# Patient Record
Sex: Male | Born: 1940
Health system: Southern US, Community
[De-identification: ages and names within clinical notes are randomized; demographics above are authoritative.]

## PROBLEM LIST (undated history)

## (undated) DIAGNOSIS — Z8711 Personal history of peptic ulcer disease: Secondary | ICD-10-CM

## (undated) DIAGNOSIS — M1990 Unspecified osteoarthritis, unspecified site: Secondary | ICD-10-CM

## (undated) DIAGNOSIS — E785 Hyperlipidemia, unspecified: Secondary | ICD-10-CM

## (undated) DIAGNOSIS — E119 Type 2 diabetes mellitus without complications: Secondary | ICD-10-CM

## (undated) DIAGNOSIS — I10 Essential (primary) hypertension: Secondary | ICD-10-CM

## (undated) DIAGNOSIS — T7840XA Allergy, unspecified, initial encounter: Secondary | ICD-10-CM

## (undated) DIAGNOSIS — R011 Cardiac murmur, unspecified: Secondary | ICD-10-CM

## (undated) DIAGNOSIS — D45 Polycythemia vera: Secondary | ICD-10-CM

## (undated) DIAGNOSIS — E781 Pure hyperglyceridemia: Secondary | ICD-10-CM

## (undated) DIAGNOSIS — K219 Gastro-esophageal reflux disease without esophagitis: Secondary | ICD-10-CM

## (undated) HISTORY — DX: Type 2 diabetes mellitus without complications: E11.9

## (undated) HISTORY — PX: TONSILLECTOMY: SUR1361

## (undated) HISTORY — DX: Essential (primary) hypertension: I10

## (undated) HISTORY — DX: Pure hyperglyceridemia: E78.1

## (undated) HISTORY — PX: WISDOM TOOTH EXTRACTION: SHX21

## (undated) HISTORY — DX: Hyperlipidemia, unspecified: E78.5

## (undated) HISTORY — DX: Gastro-esophageal reflux disease without esophagitis: K21.9

## (undated) HISTORY — DX: Cardiac murmur, unspecified: R01.1

## (undated) HISTORY — DX: Unspecified osteoarthritis, unspecified site: M19.90

## (undated) HISTORY — DX: Polycythemia vera: D45

## (undated) HISTORY — DX: Allergy, unspecified, initial encounter: T78.40XA

## (undated) HISTORY — PX: UPPER GASTROINTESTINAL ENDOSCOPY: SHX188

## (undated) HISTORY — DX: Personal history of peptic ulcer disease: Z87.11

## (undated) HISTORY — PX: MENISCUS REPAIR: SHX5179

---
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
## 1973-05-16 HISTORY — PX: HERNIA REPAIR: SHX51

## 1974-05-16 HISTORY — PX: HEMORRHOID SURGERY: SHX153

## 1993-06-23 ENCOUNTER — Encounter: Payer: Self-pay | Admitting: Internal Medicine

## 1999-04-06 ENCOUNTER — Encounter: Payer: Self-pay | Admitting: Neurosurgery

## 1999-04-06 ENCOUNTER — Ambulatory Visit (HOSPITAL_COMMUNITY): Admission: RE | Admit: 1999-04-06 | Discharge: 1999-04-06 | Payer: Self-pay | Admitting: Neurosurgery

## 1999-05-21 ENCOUNTER — Encounter: Payer: Self-pay | Admitting: Neurosurgery

## 1999-05-25 ENCOUNTER — Inpatient Hospital Stay (HOSPITAL_COMMUNITY): Admission: RE | Admit: 1999-05-25 | Discharge: 1999-05-26 | Payer: Self-pay | Admitting: Neurosurgery

## 1999-05-25 ENCOUNTER — Encounter: Payer: Self-pay | Admitting: Neurosurgery

## 1999-05-26 ENCOUNTER — Encounter: Payer: Self-pay | Admitting: Internal Medicine

## 1999-06-11 ENCOUNTER — Encounter: Payer: Self-pay | Admitting: Neurosurgery

## 1999-06-11 ENCOUNTER — Encounter: Admission: RE | Admit: 1999-06-11 | Discharge: 1999-06-11 | Payer: Self-pay | Admitting: Neurosurgery

## 2001-01-10 ENCOUNTER — Encounter: Payer: Self-pay | Admitting: Internal Medicine

## 2001-01-10 ENCOUNTER — Ambulatory Visit (HOSPITAL_COMMUNITY): Admission: RE | Admit: 2001-01-10 | Discharge: 2001-01-10 | Payer: Self-pay | Admitting: *Deleted

## 2001-01-10 ENCOUNTER — Encounter (INDEPENDENT_AMBULATORY_CARE_PROVIDER_SITE_OTHER): Payer: Self-pay | Admitting: *Deleted

## 2001-05-16 HISTORY — PX: CERVICAL SPINE SURGERY: SHX589

## 2004-08-04 ENCOUNTER — Encounter (INDEPENDENT_AMBULATORY_CARE_PROVIDER_SITE_OTHER): Payer: Self-pay | Admitting: *Deleted

## 2004-08-04 ENCOUNTER — Encounter: Payer: Self-pay | Admitting: Internal Medicine

## 2004-08-04 ENCOUNTER — Ambulatory Visit (HOSPITAL_COMMUNITY): Admission: RE | Admit: 2004-08-04 | Discharge: 2004-08-04 | Payer: Self-pay | Admitting: *Deleted

## 2009-05-16 HISTORY — PX: COLONOSCOPY: SHX174

## 2009-08-27 ENCOUNTER — Encounter: Payer: Self-pay | Admitting: Internal Medicine

## 2009-09-08 ENCOUNTER — Encounter: Payer: Self-pay | Admitting: Internal Medicine

## 2009-09-16 ENCOUNTER — Ambulatory Visit: Payer: Self-pay | Admitting: Hematology & Oncology

## 2009-09-24 ENCOUNTER — Encounter (INDEPENDENT_AMBULATORY_CARE_PROVIDER_SITE_OTHER): Payer: Self-pay | Admitting: *Deleted

## 2009-10-05 LAB — CBC WITH DIFFERENTIAL (CANCER CENTER ONLY)
Eosinophils Absolute: 0.1 10*3/uL (ref 0.0–0.5)
LYMPH#: 1.7 10*3/uL (ref 0.9–3.3)
MONO#: 0.5 10*3/uL (ref 0.1–0.9)
NEUT#: 3.7 10*3/uL (ref 1.5–6.5)
Platelets: 176 10*3/uL (ref 145–400)
RBC: 5.6 10*6/uL (ref 4.20–5.70)
WBC: 6.1 10*3/uL (ref 4.0–10.0)

## 2009-10-08 LAB — COMPREHENSIVE METABOLIC PANEL
ALT: 25 U/L (ref 0–53)
CO2: 23 mEq/L (ref 19–32)
Calcium: 9.5 mg/dL (ref 8.4–10.5)
Chloride: 104 mEq/L (ref 96–112)
Sodium: 140 mEq/L (ref 135–145)
Total Bilirubin: 0.8 mg/dL (ref 0.3–1.2)
Total Protein: 7 g/dL (ref 6.0–8.3)

## 2009-10-08 LAB — FERRITIN: Ferritin: 57 ng/mL (ref 22–322)

## 2009-10-08 LAB — LACTATE DEHYDROGENASE: LDH: 175 U/L (ref 94–250)

## 2009-10-15 ENCOUNTER — Encounter (INDEPENDENT_AMBULATORY_CARE_PROVIDER_SITE_OTHER): Payer: Self-pay | Admitting: *Deleted

## 2009-10-19 ENCOUNTER — Ambulatory Visit: Payer: Self-pay | Admitting: Internal Medicine

## 2009-10-19 ENCOUNTER — Encounter (INDEPENDENT_AMBULATORY_CARE_PROVIDER_SITE_OTHER): Payer: Self-pay | Admitting: *Deleted

## 2009-11-10 ENCOUNTER — Ambulatory Visit: Payer: Self-pay | Admitting: Internal Medicine

## 2009-11-15 ENCOUNTER — Encounter: Payer: Self-pay | Admitting: Internal Medicine

## 2010-01-22 ENCOUNTER — Ambulatory Visit: Payer: Self-pay | Admitting: Hematology & Oncology

## 2010-01-25 LAB — MANUAL DIFFERENTIAL (CHCC SATELLITE)
ALC: 1.7 10*3/uL (ref 0.9–3.3)
PLT EST ~~LOC~~: ADEQUATE
SEG: 64 % (ref 40–75)

## 2010-01-25 LAB — CBC WITH DIFFERENTIAL (CANCER CENTER ONLY)
HCT: 52.7 % — ABNORMAL HIGH (ref 38.7–49.9)
HGB: 18 g/dL — ABNORMAL HIGH (ref 13.0–17.1)
MCV: 93 fL (ref 82–98)
Platelets: 189 10*3/uL (ref 145–400)
RBC: 5.64 10*6/uL (ref 4.20–5.70)
WBC: 5.8 10*3/uL (ref 4.0–10.0)

## 2010-02-08 LAB — CBC WITH DIFFERENTIAL (CANCER CENTER ONLY)
BASO%: 3.4 % — ABNORMAL HIGH (ref 0.0–2.0)
EOS%: 2.8 % (ref 0.0–7.0)
HGB: 16.9 g/dL (ref 13.0–17.1)
LYMPH#: 1.8 10*3/uL (ref 0.9–3.3)
MCHC: 34 g/dL (ref 32.0–35.9)
NEUT#: 3.2 10*3/uL (ref 1.5–6.5)
RDW: 11.2 % (ref 10.5–14.6)
WBC: 5.9 10*3/uL (ref 4.0–10.0)

## 2010-02-26 ENCOUNTER — Ambulatory Visit: Payer: Self-pay | Admitting: Hematology & Oncology

## 2010-03-01 LAB — CBC WITH DIFFERENTIAL (CANCER CENTER ONLY)
BASO#: 0.1 10*3/uL (ref 0.0–0.2)
EOS%: 3.2 % (ref 0.0–7.0)
Eosinophils Absolute: 0.2 10*3/uL (ref 0.0–0.5)
HGB: 16.5 g/dL (ref 13.0–17.1)
LYMPH#: 1.8 10*3/uL (ref 0.9–3.3)
MONO#: 0.4 10*3/uL (ref 0.1–0.9)
NEUT#: 2.7 10*3/uL (ref 1.5–6.5)
RBC: 5.16 10*6/uL (ref 4.20–5.70)
WBC: 5.1 10*3/uL (ref 4.0–10.0)

## 2010-05-28 ENCOUNTER — Ambulatory Visit: Payer: Self-pay | Admitting: Hematology & Oncology

## 2010-05-31 LAB — CBC WITH DIFFERENTIAL (CANCER CENTER ONLY)
BASO#: 0.1 10*3/uL (ref 0.0–0.2)
BASO%: 1.9 % (ref 0.0–2.0)
EOS%: 2.7 % (ref 0.0–7.0)
Eosinophils Absolute: 0.2 10*3/uL (ref 0.0–0.5)
HCT: 50.2 % — ABNORMAL HIGH (ref 38.7–49.9)
HGB: 17 g/dL (ref 13.0–17.1)
LYMPH#: 1.7 10*3/uL (ref 0.9–3.3)
LYMPH%: 28.3 % (ref 14.0–48.0)
MCH: 30.6 pg (ref 28.0–33.4)
MCHC: 34 g/dL (ref 32.0–35.9)
MCV: 90 fL (ref 82–98)
MONO#: 0.6 10*3/uL (ref 0.1–0.9)
MONO%: 9.4 % (ref 0.0–13.0)
NEUT#: 3.5 10*3/uL (ref 1.5–6.5)
NEUT%: 57.7 % (ref 40.0–80.0)
Platelets: 172 10*3/uL (ref 145–400)
RBC: 5.56 10*6/uL (ref 4.20–5.70)
RDW: 12.6 % (ref 10.5–14.6)
WBC: 6.1 10*3/uL (ref 4.0–10.0)

## 2010-05-31 LAB — FERRITIN: Ferritin: 32 ng/mL (ref 22–322)

## 2010-05-31 LAB — CHCC SATELLITE - SMEAR

## 2010-06-15 NOTE — Procedures (Signed)
Summary: Colonoscopy pg 2/[place]  Colonoscopy/[place]   Imported By: Sherian Rein 11/12/2009 11:55:31  _____________________________________________________________________  External Attachment:    Type:   Image     Comment:   External Document

## 2010-06-15 NOTE — Letter (Signed)
Summary: Medication List/[facility]  [facility]   Imported By: Sherian Rein 11/12/2009 12:04:56  _____________________________________________________________________  External Attachment:    Type:   Image     Comment:   External Document

## 2010-06-15 NOTE — Procedures (Signed)
Summary: Colonscopy/[place]  Colonscopy/[place]   Imported By: Sherian Rein 11/12/2009 12:02:40  _____________________________________________________________________  External Attachment:    Type:   Image     Comment:   External Document

## 2010-06-15 NOTE — Procedures (Signed)
Summary: Colonoscopy  Patient: Gary Carpenter Note: All result statuses are Final unless otherwise noted.  Tests: (1) Colonoscopy (COL)   COL Colonoscopy           DONE     [facility]     [address]     [place], [place]  [address]           COLONOSCOPY PROCEDURE REPORT           PATIENT:  [name], [name]  MR#:  [id number]     BIRTHDATE:  [birth year], 69 yrs. old  GENDER:  male     ENDOSCOPIST:  Iva Boop, MD, [facility]     REF. BY:  Soyla Murphy. [name], M.D.     PROCEDURE DATE:  11/10/2009     PROCEDURE:  Colonoscopy with biopsy and snare polypectomy     ASA CLASS:  Class II     INDICATIONS:  surveillance and high-risk screening, history of     pre-cancerous (adenomatous) colon polyps diminutive adenomas     removed 2002 and 2006     MEDICATIONS:   Fentanyl 50 mcg IV, Versed 6 mg IV           DESCRIPTION OF PROCEDURE:   After the risks benefits and     alternatives of the procedure were thoroughly explained, informed     consent was obtained.  Digital rectal exam was performed and     revealed no abnormalities and normal prostate.   The LB CF-H180AL     [id number] endoscope was introduced through the anus and advanced to     the cecum, which was identified by both the appendix and ileocecal     valve, without limitations.  The quality of the prep was     excellent, using MoviPrep.  The instrument was then slowly     withdrawn as the colon was fully examined.     Insertion: 2:33 minutes and Withdrawal: 16:29 minutes     <<PROCEDUREIMAGES>>           FINDINGS:  There were multiple polyps identified and removed.     Seven polyps from splenic flexure to sigmoid. Size range 1-5 mm.     Six polyps bottle 1 and sigmoid polyp (1) in bottle 2. The polyps     were removed using cold biopsy forceps or were snared without     cautery. Retrieval was successful. Severe diverticulosis was found     in the sigmoid colon.  This was otherwise a normal examination of     the colon. This  includes right colon retroflexion.   Retroflexed     views in the rectum revealed no abnormalities.    The scope was     then withdrawn from the patient and the procedure completed.           COMPLICATIONS:  None     ENDOSCOPIC IMPRESSION:     1) Polyps, multiple removed (seven, maximum size 5mm)     2) Severe diverticulosis in the sigmoid colon     3) Otherwise normal examination, excellent prep     4) Personal history of diminutive adenoma removal 2002 and 2006.           REPEAT EXAM:  In for Colonoscopy, pending biopsy results.           Iva Boop, MD, [name]           CC:  [name], MD  The Patient           n.     eSIGNED:   Iva Boop at 11/10/2009 12:16 PM           [name], [id number]  Note: An exclamation mark (!) indicates a result that was not dispersed into the flowsheet. Document Creation Date: 11/10/2009 12:16 PM _______________________________________________________________________  (1) Order result status: Final Collection or observation date-time: 11/10/2009 12:05 Requested date-time:  Receipt date-time:  Reported date-time:  Referring Physician:   Ordering Physician: Stan Head [phone]) Specimen Source:  Source: [name] Order Number: [id number] Lab site:   Appended Document: Colonoscopy     Procedures Next Due Date:    Colonoscopy: 10/2014

## 2010-06-15 NOTE — Procedures (Signed)
Summary: Colonoscopy/[place]  Colonoscopy/[place]   Imported By: Sherian Rein 11/12/2009 12:01:43  _____________________________________________________________________  External Attachment:    Type:   Image     Comment:   External Document

## 2010-06-15 NOTE — Letter (Signed)
Summary: Patient Notice- Polyp Results  [facility]  [address] [place], [place] [address]   Phone: [phone]  Fax: [phone]        November 15, 2009 MRN: [id number]    [facility] [address] [place], [place]  [address]    Dear Mr. Swaney,  Some of the polyps removed from your colon were adenomatous. This means that they were pre-cancerous or that  they had the potential to change into cancer over time.  I recommend that you have a repeat colonoscopy in 5 years to determine if you have developed any new polyps over time. If you develop any new rectal bleeding, abdominal pain or significant bowel habit changes, please contact us before then.  Please call us if you are having persistent problems or have questions about your condition that have not been fully answered at this time.   Sincerely,  Iva Boop MD, [facility]  This letter has been electronically signed by your physician.  Appended Document: Patient Notice- Polyp Results letter mailed 7.6.11

## 2010-06-15 NOTE — Letter (Signed)
Summary: [address]  [place]   Imported By: Sherian Rein 11/12/2009 11:58:47  _____________________________________________________________________  External Attachment:    Type:   Image     Comment:   External Document

## 2010-06-15 NOTE — Letter (Signed)
Summary: [facility]   Imported By: Sherian Rein 11/12/2009 11:57:58  _____________________________________________________________________  External Attachment:    Type:   Image     Comment:   External Document

## 2010-06-15 NOTE — Letter (Signed)
Summary: [facility] Instructions  [facility]  [address] [place], [place] [address]   Phone: [phone]  Fax: [phone]       ABDULLAHI VALLONE    03-10-1941    MRN: [id number]        Procedure Day Dorna Bloom:  Jake Shark  11/10/09     Arrival Time:  10:00AM     Procedure Time:  11:00AM     Location of Procedure:                    _X _  [facility] (4th Floor)                        PREPARATION FOR COLONOSCOPY WITH MOVIPREP   Starting 5 days prior to your procedure 11/05/09 do not eat nuts, seeds, popcorn, corn, beans, peas,  salads, or any raw vegetables.  Do not take any fiber supplements (e.g. Metamucil, Citrucel, and Benefiber).  THE DAY BEFORE YOUR PROCEDURE         DATE: 11/09/09  DAY: MONDAY  1.  Drink clear liquids the entire day-NO SOLID FOOD  2.  Do not drink anything colored red or purple.  Avoid juices with pulp.  No orange juice.  3.  Drink at least 64 oz. (8 glasses) of fluid/clear liquids during the day to prevent dehydration and help the prep work efficiently.  CLEAR LIQUIDS INCLUDE: Water Jello Ice Popsicles Tea (sugar ok, no milk/cream) Powdered fruit flavored drinks Coffee (sugar ok, no milk/cream) Gatorade Juice: apple, white grape, white cranberry  Lemonade Clear bullion, consomm, broth Carbonated beverages (any kind) Strained chicken noodle soup Hard Candy                             4.  In the morning, mix first dose of MoviPrep solution:    Empty 1 Pouch A and 1 Pouch B into the disposable container    Add lukewarm drinking water to the top line of the container. Mix to dissolve    Refrigerate (mixed solution should be used within 24 hrs)  5.  Begin drinking the prep at 5:00 p.m. The MoviPrep container is divided by 4 marks.   Every 15 minutes drink the solution down to the next mark (approximately 8 oz) until the full liter is complete.   6.  Follow completed prep with 16 oz of clear liquid of your choice (Nothing  red or purple).  Continue to drink clear liquids until bedtime.  7.  Before going to bed, mix second dose of MoviPrep solution:    Empty 1 Pouch A and 1 Pouch B into the disposable container    Add lukewarm drinking water to the top line of the container. Mix to dissolve    Refrigerate  THE DAY OF YOUR PROCEDURE      DATE: 11/10/09  DAY: TUESDAY  Beginning at 6:00AM (5 hours before procedure):         1. Every 15 minutes, drink the solution down to the next mark (approx 8 oz) until the full liter is complete.  2. Follow completed prep with 16 oz. of clear liquid of your choice.    3. You may drink clear liquids until 9:00AM (2 HOURS BEFORE PROCEDURE).   MEDICATION INSTRUCTIONS  Unless otherwise instructed, you should take regular prescription medications with a small sip of water   as early as possible the morning  of your procedure.           OTHER INSTRUCTIONS  You will need a responsible adult at least 68 years of age to accompany you and drive you home.   This person must remain in the waiting room during your procedure.  Wear loose fitting clothing that is easily removed.  Leave jewelry and other valuables at home.  However, you may wish to bring a book to read or  an iPod/MP3 player to listen to music as you wait for your procedure to start.  Remove all body piercing jewelry and leave at home.  Total time from sign-in until discharge is approximately 2-3 hours.  You should go home directly after your procedure and rest.  You can resume normal activities the  day after your procedure.  The day of your procedure you should not:   Drive   Make legal decisions   Operate machinery   Drink alcohol   Return to work  You will receive specific instructions about eating, activities and medications before you leave.    The above instructions have been reviewed and explained to me by   Clide Cliff, RN______________________    I fully understand and can  verbalize these instructions _____________________________ Date _________

## 2010-06-15 NOTE — Miscellaneous (Signed)
Summary: DIR COL...AS.  Clinical Lists Changes  Medications: Added new medication of MOVIPREP 100 GM  SOLR (PEG-KCL-NACL-NASULF-NA ASC-C) As directed - Signed Rx of MOVIPREP 100 GM  SOLR (PEG-KCL-NACL-NASULF-NA ASC-C) As directed;  #1 x 0;  Signed;  Entered by: Clide Cliff RN;  Authorized by: Iva Boop MD, [name];  Method used: Electronically to [facility] [address] [place], [place]  [id number], Ph: [phone], Fax: [phone] Observations: Added new observation of ALLERGY REV: Done (10/19/2009 13:41)    Prescriptions: MOVIPREP 100 GM  SOLR (PEG-KCL-NACL-NASULF-NA ASC-C) As directed  #1 x 0   Entered by:   Clide Cliff RN   Authorized by:   Iva Boop MD, [facility]   Signed by:   Clide Cliff RN on 10/19/2009   Method used:   Electronically to        [facility] (retail)       [address]       [place], [place]  [id number]       Ph: [phone]       Fax: [phone]   RxID:   [id number]

## 2010-06-15 NOTE — Letter (Signed)
Summary: [facility] Instructions  [facility]  [address] [place], [place] [address]   Phone: [phone]  Fax: [phone]       Gary Carpenter    Jan 14, 1941    MRN: [id number]        Procedure Day /Date:  11/10/09  Tuesday     Arrival Time:  10:00am     Procedure Time:_11:00am     Location of Procedure:                    _ x_  [facility] (4th Floor)   PREPARATION FOR COLONOSCOPY WITH MOVIPREP   Starting 5 days prior to your procedure _6/23/11 _ do not eat nuts, seeds, popcorn, corn, beans, peas,  salads, or any raw vegetables.  Do not take any fiber supplements (e.g. Metamucil, Citrucel, and Benefiber).  THE DAY BEFORE YOUR PROCEDURE         DATE:   11/09/09   DAY:  Monday  1.  Drink clear liquids the entire day-NO SOLID FOOD  2.  Do not drink anything colored red or purple.  Avoid juices with pulp.  No orange juice.  3.  Drink at least 64 oz. (8 glasses) of fluid/clear liquids during the day to prevent dehydration and help the prep work efficiently.  CLEAR LIQUIDS INCLUDE: Water Jello Ice Popsicles Tea (sugar ok, no milk/cream) Powdered fruit flavored drinks Coffee (sugar ok, no milk/cream) Gatorade Juice: apple, white grape, white cranberry  Lemonade Clear bullion, consomm, broth Carbonated beverages (any kind) Strained chicken noodle soup Hard Candy                             4.  In the morning, mix first dose of MoviPrep solution:    Empty 1 Pouch A and 1 Pouch B into the disposable container    Add lukewarm drinking water to the top line of the container. Mix to dissolve    Refrigerate (mixed solution should be used within 24 hrs)  5.  Begin drinking the prep at 5:00 p.m. The MoviPrep container is divided by 4 marks.   Every 15 minutes drink the solution down to the next mark (approximately 8 oz) until the full liter is complete.   6.  Follow completed prep with 16 oz of clear liquid of your choice (Nothing red or purple).   Continue to drink clear liquids until bedtime.  7.  Before going to bed, mix second dose of MoviPrep solution:    Empty 1 Pouch A and 1 Pouch B into the disposable container    Add lukewarm drinking water to the top line of the container. Mix to dissolve    Refrigerate  THE DAY OF YOUR PROCEDURE      DATE:  11/10/09  DAY:  Tuesday  Beginning at  6:00 a.m. (5 hours before procedure):         1. Every 15 minutes, drink the solution down to the next mark (approx 8 oz) until the full liter is complete.  2. Follow completed prep with 16 oz. of clear liquid of your choice.    3. You may drink clear liquids until _ 9:00am _ (2 HOURS BEFORE PROCEDURE).   MEDICATION INSTRUCTIONS  Unless otherwise instructed, you should take regular prescription medications with a small sip of water   as early as possible the morning of your procedure.  Diabetic patients - see separate instructions.  Stop  taking Plavix or Aggrenox on  _  _  (7 days before procedure).     Stop taking Coumadin on  _ _  (5 days before procedure).  Additional medication instructions: _         OTHER INSTRUCTIONS  You will need a responsible adult at least 68 years of age to accompany you and drive you home.   This person must remain in the waiting room during your procedure.  Wear loose fitting clothing that is easily removed.  Leave jewelry and other valuables at home.  However, you may wish to bring a book to read or  an iPod/MP3 player to listen to music as you wait for your procedure to start.  Remove all body piercing jewelry and leave at home.  Total time from sign-in until discharge is approximately 2-3 hours.  You should go home directly after your procedure and rest.  You can resume normal activities the  day after your procedure.  The day of your procedure you should not:   Drive   Make legal decisions   Operate machinery   Drink alcohol   Return to work  You will receive specific  instructions about eating, activities and medications before you leave.    The above instructions have been reviewed and explained to me by   _______________________    I fully understand and can verbalize these instructions _____________________________ Date _________

## 2010-06-15 NOTE — Letter (Signed)
Summary: Previsit letter  [facility]  [address] [place], [place] [address]   Phone: [phone]  Fax: [phone]       09/24/2009 MRN: [id number]  [facility] [address] [place], [place]  [address]  Dear Mr. Berges,  Welcome to the Gastroenterology Division at [facility].    You are scheduled to see a nurse for your pre-procedure visit on 10/19/2009 at 1:30PM on the 3rd floor at [facility], [address]  We ask that you try to arrive at our office 15 minutes prior to your appointment time to allow for check-in.  Your nurse visit will consist of discussing your medical and surgical history, your immediate family medical history, and your medications.    Please bring a complete list of all your medications or, if you prefer, bring the medication bottles and we will list them.  We will need to be aware of both prescribed and over the counter drugs.  We will need to know exact dosage information as well.  If you are on blood thinners (Coumadin, Plavix, Aggrenox, Ticlid, etc.) please call our office today/prior to your appointment, as we need to consult with your physician about holding your medication.   Please be prepared to read and sign documents such as consent forms, a financial agreement, and acknowledgement forms.  If necessary, and with your consent, a friend or relative is welcome to sit-in on the nurse visit with you.  Please bring your insurance card so that we may make a copy of it.  If your insurance requires a referral to see a specialist, please bring your referral form from your primary care physician.  No co-pay is required for this nurse visit.     If you cannot keep your appointment, please call [phone] to cancel or reschedule prior to your appointment date.  This allows [place] the opportunity to schedule an appointment for another patient in need of care.    Thank you for choosing [facility] for your medical needs.   We appreciate the opportunity to care for you.  Please visit [place] at our website  to learn more about our practice.                     Sincerely.                                                                                                                   The Gastroenterology Division

## 2010-06-15 NOTE — Procedures (Signed)
Summary: Upper Endoscopy/[place]  Upper Endoscopy/[place]   Imported By: Sherian Rein 11/12/2009 11:59:56  _____________________________________________________________________  External Attachment:    Type:   Image     Comment:   External Document

## 2010-09-15 ENCOUNTER — Other Ambulatory Visit: Payer: Self-pay | Admitting: Hematology & Oncology

## 2010-09-15 ENCOUNTER — Encounter (HOSPITAL_BASED_OUTPATIENT_CLINIC_OR_DEPARTMENT_OTHER): Payer: Medicare Other | Admitting: Hematology & Oncology

## 2010-09-15 DIAGNOSIS — D649 Anemia, unspecified: Secondary | ICD-10-CM

## 2010-09-15 DIAGNOSIS — Z7982 Long term (current) use of aspirin: Secondary | ICD-10-CM

## 2010-09-15 DIAGNOSIS — D751 Secondary polycythemia: Secondary | ICD-10-CM

## 2010-09-15 LAB — CBC WITH DIFFERENTIAL (CANCER CENTER ONLY)
BASO#: 0 10*3/uL (ref 0.0–0.2)
BASO%: 0.3 % (ref 0.0–2.0)
Eosinophils Absolute: 0.1 10*3/uL (ref 0.0–0.5)
HCT: 46.3 % (ref 38.7–49.9)
LYMPH%: 26.6 % (ref 14.0–48.0)
MCV: 84 fL (ref 82–98)
MONO#: 0.7 10*3/uL (ref 0.1–0.9)
NEUT%: 58.9 % (ref 40.0–80.0)
RDW: 13.5 % (ref 11.1–15.7)
WBC: 5.9 10*3/uL (ref 4.0–10.0)

## 2010-10-01 NOTE — Discharge Summary (Signed)
[place]  Patient:    JACOBE, STUDY                          MRN: [id number] Adm. Date:  05/25/99 Disc. Date: 05/26/99 Attending:  Payton Doughty, M.D.                           Discharge Summary  ADMISSION DIAGNOSIS:  Cervical spondylosis at C5-6 and C6-7.  DISCHARGE DIAGNOSIS:  Cervical spondylosis at C5-6 and C6-7.  PROCEDURES:  Anterior cervicectomy and fusion at C5-6 and C6-7 with an A-line plate.  COMPLICATIONS:  None.  CONDITION ON DISCHARGE:  Well.  HISTORY OF PRESENT ILLNESS:  The patient is a 70 year old, right-handed white gentleman whose history and physical is recounted on the chart. He had been followed for several years with increasing neck, left shoulder and arm pain. MRI showed nerve root compression on the left side and he was admitted for anterior  cervicectomy and fusion.  PAST MEDICAL HISTORY:  Unremarkable.  PHYSICAL EXAMINATION:  GENERAL:  Unremarkable.  NEUROLOGICAL:  Left C6 and C7 radiculopathies.  HOSPITAL COURSE:  He was admitted after obtaining normal laboratory values and underwent anterior cervicectomy and fusion at C5-6 and C6-7. Postoperatively, his strength is full. His sensation is intact. He is eating and voiding normally. His incision is dry. He is being discharged home in the care of his family on Vicodin for pain.  FOLLOW-UP:  Will be in two weeks with a lateral C-spine x-ray. DD:  05/26/99 TD:  05/26/99 Job: [id number] UXL/[id number]

## 2010-10-01 NOTE — Op Note (Signed)
Gary Carpenter, Gary Carpenter                 ACCOUNT NO.:  [id number]   MEDICAL RECORD NO.:  [id number]          PATIENT TYPE:  AMB   LOCATION:  ENDO                         FACILITY:  [facility]   PHYSICIAN:  Georgiana Spinner, M.D.    DATE OF BIRTH:  01-18-1941   DATE OF PROCEDURE:  DATE OF DISCHARGE:                                 OPERATIVE REPORT   PROCEDURE:  Colonoscopy.   INDICATIONS:  Colon polyps.   ANESTHESIA:  Demerol 60, Versed 7 mg.   PROCEDURE:  With the patient mildly sedated in the left lateral decubitus  position, the Olympus videoscopic colonoscope was inserted into the rectum,  after a normal rectal exam, and passed under direct vision to the cecum,  identified by Crow's foot of the cecum and ileocecal valve, both which were  photographed.  From this point, the colonoscope was slowly withdrawn taking  circumferential views of the colonic mucosa stopping only to photograph a  polyp in the few folds removed from the ileocecal valve. This was removed  using hot biopsy forceps technique, setting of 20/200 blended current.  We  next stopped at approximately 15 cm from the anal verge at which point a  second polyp was seen, photographed, and it too was removed at this time  using snare cautery technique with the same setting of 20/200 blended  current.  The endoscope was withdrawn to the rectum which appeared normal on  direct and retroflexed view.  The endoscope was straightened and withdrawn.  The patient's vital signs and pulse oximeter remained stable. The patient  tolerated the procedure well without apparent complications.   FINDINGS:  Polyps as described above, one just above the cecum and one at 15  cm from the anal verge, both removed. Await biopsy report. The patient will  call me for results and follow up with me as an outpatient.      GMO/MEDQ  D:  08/04/2004  T:  08/04/2004  Job:  [id number]

## 2010-10-01 NOTE — H&P (Signed)
[place]  Patient:    Gary Carpenter, Gary Carpenter                          MRN: Adm. Date:  05/25/99 Dictator:   Payton Doughty, M.D.                         History and Physical  ADMITTING DIAGNOSIS: 1.  C5-6 and C6-7 spondylosis with a C6 and C7 radiculopathy.  CHIEF COMPLAINT:  Mr. Hinderliter is a 70 year old right handed white gentleman whom I  first saw in December 1997 for scapular pain.  He had soreness out over his trapezius, fatigue in both hands, subjective weakness in his arms.  He underwent an MRI at that time that showed some spondylitic change and some osteophytic intrusion.  He participated in physical therapy and did well and then returned o my office in November with marked increase in his neck pain, pain down his arm. He at that time had weakness in both the biceps and triceps on the left side with diminished sensation.  Films demonstrated a progression of spondylosis at C5-6 nd C6-7 with bi-foraminal narrowing and he is now admitted for an anterior cervical diskectomy and fusion.  PAST MEDICAL HISTORY:  Unremarkable.  He had perirectal abscess followed by Dr.  Rolene Course in 1983.  ALLERGIES:  None.  FAMILY HISTORY:  Mother is deceased of cancer.  Daddy is age 31 and in good health.  SOCIAL HISTORY:  He does not smoke and drinks alcohol on a social basis.  He is a Animator.  REVIEW OF SYSTEMS:  Unremarkable.  PHYSICAL EXAMINATION:  HEENT:  Within normal limits.  He has limited range of motion of his neck with interscapular pain.  CHEST:  Clear.  CARDIAC:  Regular rate and rhythm.  ABDOMEN:  Nontender, no hepatosplenomegaly.  EXTREMITIES:  Without clubbing or cyanosis.  Peripheral pulses good.  GU:  Deferred.  NEUROLOGIC:  Awake, alert and oriented.  Cranial nerves intact.  Motor exam shows 5/5 strength throughout the right upper extremity, left upper extremity has weakness of the biceps and triceps at 4+/5.  He has  left C7 sensory deficit. Reflexes are absent on the left biceps and triceps, 1 at the right biceps, 1 at the right triceps.  Lower extremities are non-myelopathic.  Films results have been reviewed with progression of spondylosis at C5-6 and C6-7 with bi-foraminal narrowing.  CLINICAL IMPRESSION: 1.  Left C6 and C7 radiculopathy secondary to C5-6 and C6-7 spondylosis.  PLAN:  Anterior cervical diskectomy and fusion at C5-C6 and C6-C7.  The risks and benefits of this approach have been discussed with him and he wishes to proceed.DD:  05/25/99 TD:  05/25/99 Job: [id number]

## 2010-10-01 NOTE — Procedures (Signed)
[facility]  Patient:    Gary Carpenter, Gary Carpenter [facility] Visit Number: [id number] MRN: [id number]          Service Type: END Location: ENDO Attending Physician:  Sabino Gasser Proc. Date: 01/10/01 Adm. Date:  01/10/2001                             Procedure Report  PROCEDURE:  Colonoscopy.  INDICATIONS:  Colon polyps.  ANESTHESIA:  Demerol 25, Versed 3 mg.  DESCRIPTION OF PROCEDURE:  With the patient mildly sedated in the left lateral decubitus position, the Olympus videoscopic colonoscope was inserted in the rectum after normal rectal exam and passed under direct vision to the cecum, identified by the ileocecal valve and appendiceal orifice, both of which were photographed.  From this point, the colonoscope was slowly withdrawn, taking circumferential views of the entire colonic mucosa until we reached the rectum.  We stopped first in the proximal transverse colon where two adjacent polyps were seen and removed, both with a setting of 3-3 blended current, one with snare cautery technique, one with hot biopsy forceps technique.  We stopped once again at approximately 40 cm from the anal verge, at which point another polyp was seen, photographed, and removed, again using snare cautery technique.  The base was touched with the wire to provide further hemostasis. There was a small polyp removed in the rectosigmoid area by hot biopsy forceps technique.  It was photographed as well.  In the rectum, there was an area that appeared to be raised.  It was not clear whether this was just normal mucosa that had been raised or whether it was adenomatous tissue.  Therefore, it was photographed and cold biopsies only were taken.  Once accomplished, the endoscope, as stated, had been pulled back to the rectum and placed in retroflexion to view the anal canal from above.  This appeared normal.  The endoscope was straightened and withdrawn.  The patients vital signs and  pulse oximeter remained stable.  The patient tolerated the procedure well without apparent complications.  FINDINGS:  Polyps of proximal transverse colon 40 cm from the anal verge, rectosigmoid area, and possibly a polyp in the rectum as well.  PLAN:  We will await the biopsy report and will have follow up with me for the results and as an outpatient.  May need further study if the last biopsy proves to be adenomatous. Attending Physician:  Sabino Gasser DD:  01/10/01 TD:  01/10/01 Job: [id number]

## 2010-11-24 ENCOUNTER — Ambulatory Visit (HOSPITAL_BASED_OUTPATIENT_CLINIC_OR_DEPARTMENT_OTHER): Payer: Medicare Other | Admitting: Hematology & Oncology

## 2010-11-24 ENCOUNTER — Other Ambulatory Visit: Payer: Self-pay | Admitting: Hematology & Oncology

## 2010-11-24 DIAGNOSIS — D751 Secondary polycythemia: Secondary | ICD-10-CM

## 2010-11-24 LAB — CBC WITH DIFFERENTIAL (CANCER CENTER ONLY)
BASO#: 0 10*3/uL (ref 0.0–0.2)
Eosinophils Absolute: 0.1 10*3/uL (ref 0.0–0.5)
HGB: 16.8 g/dL (ref 13.0–17.1)
LYMPH#: 1.8 10*3/uL (ref 0.9–3.3)
MCH: 31.8 pg (ref 28.0–33.4)
MONO#: 0.5 10*3/uL (ref 0.1–0.9)
NEUT#: 2.9 10*3/uL (ref 1.5–6.5)
Platelets: 144 10*3/uL — ABNORMAL LOW (ref 145–400)
RBC: 5.28 10*6/uL (ref 4.20–5.70)
WBC: 5.3 10*3/uL (ref 4.0–10.0)

## 2011-04-13 ENCOUNTER — Other Ambulatory Visit (HOSPITAL_BASED_OUTPATIENT_CLINIC_OR_DEPARTMENT_OTHER): Payer: Medicare Other | Admitting: Lab

## 2011-04-13 ENCOUNTER — Other Ambulatory Visit: Payer: Self-pay | Admitting: Hematology & Oncology

## 2011-04-13 ENCOUNTER — Ambulatory Visit (HOSPITAL_BASED_OUTPATIENT_CLINIC_OR_DEPARTMENT_OTHER): Payer: Medicare Other | Admitting: Hematology & Oncology

## 2011-04-13 VITALS — BP 155/94 | HR 63 | Temp 97.9°F | Ht 77.0 in | Wt 227.0 lb

## 2011-04-13 DIAGNOSIS — R5381 Other malaise: Secondary | ICD-10-CM

## 2011-04-13 DIAGNOSIS — D751 Secondary polycythemia: Secondary | ICD-10-CM

## 2011-04-13 DIAGNOSIS — Z7982 Long term (current) use of aspirin: Secondary | ICD-10-CM

## 2011-04-13 DIAGNOSIS — D649 Anemia, unspecified: Secondary | ICD-10-CM

## 2011-04-13 LAB — CBC WITH DIFFERENTIAL (CANCER CENTER ONLY)
BASO#: 0 10*3/uL (ref 0.0–0.2)
EOS%: 2.2 % (ref 0.0–7.0)
HCT: 44.3 % (ref 38.7–49.9)
HGB: 16.7 g/dL (ref 13.0–17.1)
MCH: 32.2 pg (ref 28.0–33.4)
MCHC: 37.7 g/dL — ABNORMAL HIGH (ref 32.0–35.9)
MONO%: 11.2 % (ref 0.0–13.0)
NEUT%: 58.1 % (ref 40.0–80.0)

## 2011-04-13 NOTE — Progress Notes (Signed)
This office note has been dictated.

## 2011-04-14 ENCOUNTER — Telehealth: Payer: Self-pay | Admitting: *Deleted

## 2011-04-14 NOTE — Progress Notes (Deleted)
CC:   Soyla Murphy. [name], M.D.  DIAGNOSIS:  [place] polycythemia.  CURRENT THERAPY:  Phlebotomy as needed to keep hematocrit below 45%.  INTERIM HISTORY:  Gary Carpenter comes in for his follow-up.  We last saw him back in July.  Since then, he has been pretty busy.  He actually went to [place] about a month or so ago.  He had a nice time while he was over there.  He went with a group out of [place].  He has had some issues with fatigue.  He says he can go a while and then all of a sudden he just feels very tired.  I am not sure what is the source of this.  He has had no headache.  There has been no double vision or blurred vision.  He has had no change in bowel or bladder habits.  There have been no rashes.  He has had no leg swelling.  He was last phlebotomized back in May.  I suppose there might be some element of iron deficiency which could be causing his intermittent fatigue.  I also suppose that he may have low testosterone.  However, I told him that testosterone replacement therapy probably would not be a good idea as it can drive up his blood counts.  PHYSICAL EXAMINATION:  General Appearance:  This is a well-developed, well-nourished white gentleman in no obvious distress.  Vital Signs: 97.9, pulse 63, respiratory rate 18, blood pressure 155/94.  Weight is 227.  Head and Neck Exam:  Shows a normocephalic, atraumatic skull. There are no ocular or oral lesions.  There are no palpable cervical or supraclavicular lymph nodes.  Lungs:  Clear bilaterally.  There are no rales, wheezes or rhonchi.  Cardiac Exam:  Regular rate and rhythm with a normal S1 and S2.  There are no murmurs, rubs or bruits.  Abdominal Exam:  Soft with good bowel sounds.  There is no palpable abdominal mass.  He has no fluid wave.  There is no palpable hepatosplenomegaly. Extremities:  Show no clubbing, cyanosis or edema.  Skin Exam:  No rashes, ecchymosis, or petechiae.  He does not have any type of  ruddy complexion.  Neurological Exam:  No focal neurological deficits.  LABORATORY STUDIES:  White cell count is 6.3, hemoglobin 16.7, hematocrit 44.3 with a platelet count of 145.  MCV is 85.  IMPRESSION:  Gary Carpenter is a 70 year old gentleman with [place] polycythemia.  Again, he is doing okay with this.  I do not see any complications.  He is still taking aspirin, which I think is very important.  We will plan to get him back in March.  I think we can get him through the wintertime without having to see him.    ______________________________ Gary Carpenter, M.D. PRE/MEDQ  D:  04/13/2011  T:  04/14/2011  Job:  [id number]

## 2011-04-14 NOTE — Telephone Encounter (Signed)
Mailed 3-13 schedule °

## 2011-06-24 DIAGNOSIS — E7800 Pure hypercholesterolemia, unspecified: Secondary | ICD-10-CM | POA: Diagnosis not present

## 2011-06-28 DIAGNOSIS — E7800 Pure hypercholesterolemia, unspecified: Secondary | ICD-10-CM | POA: Diagnosis not present

## 2011-06-28 DIAGNOSIS — R069 Unspecified abnormalities of breathing: Secondary | ICD-10-CM | POA: Diagnosis not present

## 2011-07-14 NOTE — Progress Notes (Signed)
CC:   Gary Carpenter, M.D.  DIAGNOSIS:  [place] polycythemia.  CURRENT THERAPY:  Phlebotomy as needed to keep hematocrit below 45%.  INTERIM HISTORY:  Gary Carpenter comes in for his follow-up.  We last saw him back in July.  Since then, he has been pretty busy.  He actually went to [place] about a month or so ago.  He had a nice time while he was over there.  He went with a group out of [place].  He has had some issues with fatigue.  He says he can go a while and then all of a sudden he just feels very tired.  I am not sure what is the source of this.  He has had no headache.  There has been no double vision or blurred vision.  He has had no change in bowel or bladder habits.  There have been no rashes.  He has had no leg swelling.  He was last phlebotomized back in May.  I suppose there might be some element of iron deficiency which could be causing his intermittent fatigue.  I also suppose that he may have low testosterone.  However, I told him that testosterone replacement therapy probably would not be a good idea as it can drive up his blood counts.  PHYSICAL EXAMINATION:  General Appearance:  This is a well-developed, well-nourished white gentleman in no obvious distress.  Vital Signs: 97.9, pulse 63, respiratory rate 18, blood pressure 155/94.  Weight is 227.  Head and Neck Exam:  Shows a normocephalic, atraumatic skull. There are no ocular or oral lesions.  There are no palpable cervical or supraclavicular lymph nodes.  Lungs:  Clear bilaterally.  There are no rales, wheezes or rhonchi.  Cardiac Exam:  Regular rate and rhythm with a normal S1 and S2.  There are no murmurs, rubs or bruits.  Abdominal Exam:  Soft with good bowel sounds.  There is no palpable abdominal mass.  He has no fluid wave.  There is no palpable hepatosplenomegaly. Extremities:  Show no clubbing, cyanosis or edema.  Skin Exam:  No rashes, ecchymosis, or petechiae.  He does not have any type of  ruddy complexion.  Neurological Exam:  No focal neurological deficits.  LABORATORY STUDIES:  White cell count is 6.3, hemoglobin 16.7, hematocrit 44.3 with a platelet count of 145.  MCV is 85.  IMPRESSION:  Gary Carpenter is a 70-year-old gentleman with [place] polycythemia.  Again, he is doing okay with this.  I do not see any complications.  He is still taking aspirin, which I think is very important.  We will plan to get him back in March.  I think we can get him through the wintertime without having to see him.    ______________________________ Peter R Ennever, M.D. PRE/MEDQ  D:  04/13/2011  T:  04/14/2011  Job:  [id number] 

## 2011-08-11 ENCOUNTER — Other Ambulatory Visit (HOSPITAL_BASED_OUTPATIENT_CLINIC_OR_DEPARTMENT_OTHER): Payer: Medicare Other | Admitting: Lab

## 2011-08-11 ENCOUNTER — Ambulatory Visit (HOSPITAL_BASED_OUTPATIENT_CLINIC_OR_DEPARTMENT_OTHER): Payer: Medicare Other | Admitting: Hematology & Oncology

## 2011-08-11 VITALS — BP 145/78 | HR 67 | Temp 98.1°F | Ht 73.0 in | Wt 227.0 lb

## 2011-08-11 DIAGNOSIS — D45 Polycythemia vera: Secondary | ICD-10-CM

## 2011-08-11 DIAGNOSIS — D751 Secondary polycythemia: Secondary | ICD-10-CM

## 2011-08-11 LAB — CBC WITH DIFFERENTIAL (CANCER CENTER ONLY)
Eosinophils Absolute: 0.2 10*3/uL (ref 0.0–0.5)
HCT: 46.5 % (ref 38.7–49.9)
LYMPH%: 28.7 % (ref 14.0–48.0)
MCV: 86 fL (ref 82–98)
MONO#: 0.6 10*3/uL (ref 0.1–0.9)
NEUT%: 58.3 % (ref 40.0–80.0)
RBC: 5.38 10*6/uL (ref 4.20–5.70)
WBC: 6.2 10*3/uL (ref 4.0–10.0)

## 2011-08-11 NOTE — Progress Notes (Signed)
This office note has been dictated.

## 2011-08-12 NOTE — Progress Notes (Signed)
CC:   Soyla Murphy. [name], M.D.  DIAGNOSIS:  Spurious/[place] polycythemia.  CURRENT THERAPY:  Phlebotomy to maintain hematocrit less than 45%.  INTERIM HISTORY:  [name] comes in for follow-up.  He is doing fairly well.  He got through the holidays without any problems.  He said he was a little bit "gluttonous."  His last phlebotomy was back in May of 2012.  When we last saw him, his ferritin was 71.  Iron saturation was 34%.  He has had no headache.  He has had no pain in his hands or feet.  He has had no change in bowel or bladder habits.  He is still taking his aspirin which I think is very helpful for him.  PHYSICAL EXAMINATION:  General Appearance:  This is a well-developed, well-nourished white gentleman in no obvious distress.  Vital Signs: Show a temperature of 98.1, pulse 67, respiratory rate 14, blood pressure 145/87.  Weight is 227.  Head and Neck Exam:  Shows a normocephalic, atraumatic skull.  There are no ocular or oral lesions. There are no palpable cervical or supraclavicular lymph nodes.  There is no facial plethora.  His conjunctivae are not inflamed.  Lungs:  Clear bilaterally.  Cardiac Exam:  Regular rate and rhythm with a normal S1 and S2.  There are no murmurs, rubs or bruits.  Abdominal Exam:  Soft with good bowel sounds.  There is no palpable abdominal mass.  There is no palpable hepatosplenomegaly.  Extremities:  Show no clubbing, cyanosis or edema.  Neurological Exam:  Shows no focal neurological deficits.  LABORATORY STUDIES:  White cell count is 6.2, hemoglobin 17, hematocrit 46.5, platelet count 142.  MCV is 86.  IMPRESSION:  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  We did do a JAK2 assay on him which was negative.  I am not convinced that we need to phlebotomize him right now.  I suspect that we may need to phlebotomize him the next time we see him.  I want to see him back in May at which point we will probably  plan for a phlebotomy so that we can get him through the summertime.   ______________________________ Josph Macho, M.D. PRE/MEDQ  D:  08/11/2011  T:  08/12/2011  Job:  [id number]

## 2011-08-31 DIAGNOSIS — R0602 Shortness of breath: Secondary | ICD-10-CM | POA: Diagnosis not present

## 2011-08-31 DIAGNOSIS — E7800 Pure hypercholesterolemia, unspecified: Secondary | ICD-10-CM | POA: Diagnosis not present

## 2011-09-12 DIAGNOSIS — I10 Essential (primary) hypertension: Secondary | ICD-10-CM | POA: Diagnosis not present

## 2011-09-12 DIAGNOSIS — R5381 Other malaise: Secondary | ICD-10-CM | POA: Diagnosis not present

## 2011-09-12 DIAGNOSIS — R5383 Other fatigue: Secondary | ICD-10-CM | POA: Diagnosis not present

## 2011-10-04 DIAGNOSIS — I10 Essential (primary) hypertension: Secondary | ICD-10-CM | POA: Diagnosis not present

## 2011-10-04 DIAGNOSIS — E119 Type 2 diabetes mellitus without complications: Secondary | ICD-10-CM | POA: Diagnosis not present

## 2011-10-04 DIAGNOSIS — E7800 Pure hypercholesterolemia, unspecified: Secondary | ICD-10-CM | POA: Diagnosis not present

## 2011-10-11 DIAGNOSIS — E7800 Pure hypercholesterolemia, unspecified: Secondary | ICD-10-CM | POA: Diagnosis not present

## 2011-10-13 ENCOUNTER — Other Ambulatory Visit: Payer: Medicare Other | Admitting: Lab

## 2011-10-13 ENCOUNTER — Ambulatory Visit (HOSPITAL_BASED_OUTPATIENT_CLINIC_OR_DEPARTMENT_OTHER): Payer: Medicare Other | Admitting: Hematology & Oncology

## 2011-10-13 VITALS — BP 123/69 | HR 64 | Temp 96.9°F | Ht 72.0 in | Wt 226.0 lb

## 2011-10-13 DIAGNOSIS — D751 Secondary polycythemia: Secondary | ICD-10-CM | POA: Diagnosis not present

## 2011-10-13 DIAGNOSIS — D45 Polycythemia vera: Secondary | ICD-10-CM | POA: Diagnosis not present

## 2011-10-13 LAB — CBC WITH DIFFERENTIAL (CANCER CENTER ONLY)
BASO%: 0.4 % (ref 0.0–2.0)
LYMPH#: 1.7 10*3/uL (ref 0.9–3.3)
MONO#: 0.5 10*3/uL (ref 0.1–0.9)
Platelets: 152 10*3/uL (ref 145–400)
RDW: 13 % (ref 11.1–15.7)
WBC: 5.7 10*3/uL (ref 4.0–10.0)

## 2011-10-13 LAB — FERRITIN: Ferritin: 86 ng/mL (ref 22–322)

## 2011-10-13 NOTE — Progress Notes (Signed)
This office note has been dictated.

## 2011-10-14 NOTE — Progress Notes (Signed)
CC:   Soyla Murphy. [name], M.D.  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  CURRENT THERAPY:  Phlebotomy to maintain hematocrit less than 45%.  INTERIM HISTORY:  Gary Carpenter comes in for followup.  He has done quite well.  I think his last phlebotomy was back in May 2012.  He has had no issues since we last saw him.  He is trying to exercise. His last ferritin was 71 back in November.  He has had no bleeding or bruising.  He has had no headache.  He has had no pain in his hands or feet.  He is on Crestor.  He says that this will hopefully help with any potential coronary artery disease blockages.  He has had no change in bowel or bladder habits.  He has had no rashes. He has had no cough.  He has had no fever, sweats or chills.  PHYSICAL EXAMINATION:  This is a well-developed, well-nourished white gentleman in no obvious distress.  Vital signs:  Temperature of 96.9, pulse 64, respiratory rate 20, blood pressure 120/69.  Weight is 226. Head and neck:  Normocephalic, atraumatic skull.  There are no ocular or oral lesions.  There are no palpable cervical or supraclavicular lymph nodes.  Lungs:  Clear bilaterally.  Cardiac:  Regular rate and rhythm with a normal S1 and S2.  There are no murmurs, rubs or bruits. Abdomen:  Soft with good bowel sounds.  There is no palpable abdominal mass.  There is no fluid wave.  There is no palpable hepatosplenomegaly. Back:  No tenderness of the spine, ribs or hips.  Extremities:  No clubbing, cyanosis or edema.  Neurologic:  No focal neurological deficits.  Skin:  Some slight stasis dermatitis type changes in his lower legs.  LABORATORY STUDIES:  White cell count is 5.7, hemoglobin 16.6, hematocrit 45, platelet count 152.  MCV is 87.  IMPRESSION:  Gary Carpenter is a 71 year old gentleman with polycythemia. This is a spurious type of polycythemia.  Will plan to get back now in 6 months.  I really do not think we need to get any blood work on him in  between visits.  He is really doing good job in staying active and staying well hydrated.    ______________________________ Josph Macho, M.D. PRE/MEDQ  D:  10/13/2011  T:  10/14/2011  Job:  [id number]

## 2011-11-22 DIAGNOSIS — H52229 Regular astigmatism, unspecified eye: Secondary | ICD-10-CM | POA: Diagnosis not present

## 2011-11-22 DIAGNOSIS — E7800 Pure hypercholesterolemia, unspecified: Secondary | ICD-10-CM | POA: Diagnosis not present

## 2011-11-22 DIAGNOSIS — H353190 Nonexudative age-related macular degeneration, unspecified eye, stage unspecified: Secondary | ICD-10-CM | POA: Diagnosis not present

## 2011-11-22 DIAGNOSIS — H5200 Hypermetropia, unspecified eye: Secondary | ICD-10-CM | POA: Diagnosis not present

## 2011-11-24 DIAGNOSIS — E7800 Pure hypercholesterolemia, unspecified: Secondary | ICD-10-CM | POA: Diagnosis not present

## 2011-11-24 DIAGNOSIS — E669 Obesity, unspecified: Secondary | ICD-10-CM | POA: Diagnosis not present

## 2011-12-19 ENCOUNTER — Other Ambulatory Visit: Payer: Self-pay | Admitting: Dermatology

## 2011-12-19 DIAGNOSIS — L723 Sebaceous cyst: Secondary | ICD-10-CM | POA: Diagnosis not present

## 2011-12-19 DIAGNOSIS — L821 Other seborrheic keratosis: Secondary | ICD-10-CM | POA: Diagnosis not present

## 2011-12-19 DIAGNOSIS — L570 Actinic keratosis: Secondary | ICD-10-CM | POA: Diagnosis not present

## 2012-01-02 DIAGNOSIS — L570 Actinic keratosis: Secondary | ICD-10-CM | POA: Diagnosis not present

## 2012-01-02 DIAGNOSIS — L821 Other seborrheic keratosis: Secondary | ICD-10-CM | POA: Diagnosis not present

## 2012-01-02 DIAGNOSIS — L723 Sebaceous cyst: Secondary | ICD-10-CM | POA: Diagnosis not present

## 2012-04-18 ENCOUNTER — Other Ambulatory Visit (HOSPITAL_BASED_OUTPATIENT_CLINIC_OR_DEPARTMENT_OTHER): Payer: Medicare Other | Admitting: Lab

## 2012-04-18 ENCOUNTER — Ambulatory Visit: Payer: Medicare Other

## 2012-04-18 ENCOUNTER — Ambulatory Visit (HOSPITAL_BASED_OUTPATIENT_CLINIC_OR_DEPARTMENT_OTHER): Payer: Medicare Other | Admitting: Hematology & Oncology

## 2012-04-18 VITALS — BP 145/82 | HR 62 | Temp 97.4°F | Resp 18 | Ht 72.0 in | Wt 228.0 lb

## 2012-04-18 DIAGNOSIS — D751 Secondary polycythemia: Secondary | ICD-10-CM

## 2012-04-18 DIAGNOSIS — D45 Polycythemia vera: Secondary | ICD-10-CM

## 2012-04-18 LAB — CBC WITH DIFFERENTIAL (CANCER CENTER ONLY)
BASO%: 0.5 % (ref 0.0–2.0)
EOS%: 3.5 % (ref 0.0–7.0)
HGB: 16.3 g/dL (ref 13.0–17.1)
LYMPH#: 1.9 10*3/uL (ref 0.9–3.3)
MCHC: 36.5 g/dL — ABNORMAL HIGH (ref 32.0–35.9)
NEUT#: 3.6 10*3/uL (ref 1.5–6.5)
Platelets: 136 10*3/uL — ABNORMAL LOW (ref 145–400)
RDW: 12.7 % (ref 11.1–15.7)

## 2012-04-18 LAB — FERRITIN: Ferritin: 149 ng/mL (ref 22–322)

## 2012-04-18 LAB — IRON AND TIBC
Iron: 87 ug/dL (ref 42–165)
UIBC: 214 ug/dL (ref 125–400)

## 2012-04-18 NOTE — Progress Notes (Signed)
No phlebotomy today per dr. Lupita Leash due to blood counts

## 2012-04-18 NOTE — Progress Notes (Signed)
This office note has been dictated.

## 2012-04-19 NOTE — Progress Notes (Signed)
CC:   Soyla Murphy. Gary Carpenter, M.D.  DIAGNOSIS:  Spurious polycythemia.  CURRENT THERAPY:  Phlebotomy to maintain hematocrit less than 45%.  INTERIM HISTORY:  Gary Carpenter comes in for followup.  He is doing quite well.  He has not been phlebotomized for a year-and-a-half.  His last phlebotomy was back in May 2012.  We last saw him in May 2013.  He has had no issues.  He is feeling well. He had a good Thanksgiving.  Next year, he may go to [place] for vacation.  There has been no change in his medications as far as I can tell.  He is watching what he eats.  He is trying to exercise.  His last ferritin back in May was 86.  PHYSICAL EXAMINATION:  General:  This is a well-developed, well- nourished white gentleman, in no obvious distress.  Vital signs: Temperature 97.4, pulse 62, respiratory rate 18, blood pressure 145/82. Weight is 228.  Head and neck:  Normocephalic, atraumatic skull.  There are no ocular or oral lesions.  There are no palpable cervical or supraclavicular lymph nodes.  Lungs:  Clear bilaterally.  Cardiac: Regular rate and rhythm, with a normal S1, S2.  There are no murmurs, rubs or bruits.  Abdomen:  Soft with good bowel sounds.  There is no palpable abdominal mass.  There is no fluid wave.  There is no palpable hepatosplenomegaly.  Extremities:  Show no clubbing, cyanosis or edema. Skin:  Shows no rashes, ecchymosis or petechia.  He does not have a ruddy complexion.  LABORATORY STUDIES:  White cell count 6.3, hemoglobin 16.3, hematocrit 44.6, platelet count 136,000.  MCV is 86.  IMPRESSION:  Gary Carpenter is a really nice 71 year old gentleman with spurious or Gaisbock's polycythemia.  He is doing quite well with this. He is asymptomatic with this.  I forgot to mention that he is taking baby aspirin, which I think is helpful.  We still do not need to phlebotomize him.  I think we can probably wait until May 2014 before we get him  back.   ______________________________ Gary Carpenter, M.D. PRE/MEDQ  D:  04/18/2012  T:  04/19/2012  Job:  [id number]

## 2012-07-06 DIAGNOSIS — R7309 Other abnormal glucose: Secondary | ICD-10-CM | POA: Diagnosis not present

## 2012-07-06 DIAGNOSIS — E7800 Pure hypercholesterolemia, unspecified: Secondary | ICD-10-CM | POA: Diagnosis not present

## 2012-07-06 DIAGNOSIS — Z0000 Encounter for general adult medical examination without abnormal findings: Secondary | ICD-10-CM | POA: Diagnosis not present

## 2012-07-06 DIAGNOSIS — Z125 Encounter for screening for malignant neoplasm of prostate: Secondary | ICD-10-CM | POA: Diagnosis not present

## 2012-07-06 DIAGNOSIS — I10 Essential (primary) hypertension: Secondary | ICD-10-CM | POA: Diagnosis not present

## 2012-07-12 DIAGNOSIS — I10 Essential (primary) hypertension: Secondary | ICD-10-CM | POA: Diagnosis not present

## 2012-07-12 DIAGNOSIS — E7800 Pure hypercholesterolemia, unspecified: Secondary | ICD-10-CM | POA: Diagnosis not present

## 2012-07-12 DIAGNOSIS — R809 Proteinuria, unspecified: Secondary | ICD-10-CM | POA: Diagnosis not present

## 2012-07-12 DIAGNOSIS — E119 Type 2 diabetes mellitus without complications: Secondary | ICD-10-CM | POA: Diagnosis not present

## 2012-10-09 ENCOUNTER — Ambulatory Visit (HOSPITAL_BASED_OUTPATIENT_CLINIC_OR_DEPARTMENT_OTHER): Payer: Medicare Other | Admitting: Hematology & Oncology

## 2012-10-09 ENCOUNTER — Ambulatory Visit (HOSPITAL_BASED_OUTPATIENT_CLINIC_OR_DEPARTMENT_OTHER): Payer: Medicare Other

## 2012-10-09 ENCOUNTER — Other Ambulatory Visit (HOSPITAL_BASED_OUTPATIENT_CLINIC_OR_DEPARTMENT_OTHER): Payer: Medicare Other | Admitting: Lab

## 2012-10-09 VITALS — BP 126/84 | HR 60 | Resp 20

## 2012-10-09 VITALS — BP 137/71 | HR 60 | Temp 97.3°F | Resp 18 | Ht 72.0 in | Wt 222.0 lb

## 2012-10-09 DIAGNOSIS — D509 Iron deficiency anemia, unspecified: Secondary | ICD-10-CM

## 2012-10-09 DIAGNOSIS — D751 Secondary polycythemia: Secondary | ICD-10-CM

## 2012-10-09 DIAGNOSIS — D45 Polycythemia vera: Secondary | ICD-10-CM

## 2012-10-09 LAB — CBC WITH DIFFERENTIAL (CANCER CENTER ONLY)
BASO%: 0.3 % (ref 0.0–2.0)
Eosinophils Absolute: 0.1 10*3/uL (ref 0.0–0.5)
LYMPH%: 30.5 % (ref 14.0–48.0)
MCH: 31.9 pg (ref 28.0–33.4)
MCV: 89 fL (ref 82–98)
MONO%: 9.7 % (ref 0.0–13.0)
NEUT%: 57.4 % (ref 40.0–80.0)
Platelets: 149 10*3/uL (ref 145–400)
RDW: 13.1 % (ref 11.1–15.7)

## 2012-10-09 NOTE — Progress Notes (Signed)
This office note has been dictated.

## 2012-10-09 NOTE — Progress Notes (Signed)
CC:   Gary Carpenter. Gary Carpenter, M.D.  DIAGNOSIS:  Polycythemia, spurious.  CURRENT THERAPY:  Phlebotomy to maintain hematocrit less than 45%.  INTERIM HISTORY:  Gary Carpenter comes in for followup.  He is feeling fairly well.  We last saw him back in December.  He got through the wintertime without much difficulty.  His last phlebotomy was back in May 2013.  We are trying to manage his iron with his phlebotomies.  His last ferritin was 148 back in December.  He has had no headache.  There have been no joint problems.  He has had no change in bowel or bladder habits.  He is going to be going to [place] in November.  PHYSICAL EXAMINATION:  General:  This is a well-developed, well- nourished white gentleman in no obvious distress.  Vital signs:  Show temperature of 97.3, pulse 60, respiratory rate 18, blood pressure 137/71.  Weight is 222.  Head and neck:  Shows a normocephalic, atraumatic skull.  There are no ocular or oral lesions.  There are no palpable cervical or supraclavicular lymph nodes.  Lungs:  Clear bilaterally.  Cardiac:  Regular rate and rhythm with a normal S1 and S2. There are no murmurs, rubs or bruits.  Abdomen:  Soft with good bowel sounds.  There is no palpable abdominal mass.  There is no palpable hepatosplenomegaly.  Extremities:  Show no clubbing, cyanosis or edema. Skin:  Does show some slight plethora to his face.  He has a little bit of a ruddy complexion with his arms and legs.  Neurological:  Shows no focal neurological deficits.  LABORATORY STUDIES:  White cell count is 6.8, hemoglobin 16.8, hematocrit 47, platelet count 149.  IMPRESSION:  Gary Carpenter is a nice 72 year old gentleman with spurious polycythemia.  Again, we are trying to keep his hematocrit below 45%.  He is taking aspirin.  He is doing well with this.  So far, he has been asymptomatic.  I will phlebotomize him.  I think this will get him through the summertime.  I will plan to see him back in the  fall before he goes to [place].   ______________________________ Josph Macho, M.D. PRE/MEDQ  D:  10/09/2012  T:  10/09/2012  Job:  [id number]

## 2012-10-09 NOTE — Patient Instructions (Signed)

## 2012-10-09 NOTE — Progress Notes (Signed)
Gary Carpenter presents today for phlebotomy per MD orders. Phlebotomy procedure started at 1108 and ended at 1115. Approximatey 500 mls removed. Patient observed for 30 minutes after procedure without any incident. Patient tolerated procedure well. IV needle removed intact.

## 2013-01-08 DIAGNOSIS — H353190 Nonexudative age-related macular degeneration, unspecified eye, stage unspecified: Secondary | ICD-10-CM | POA: Diagnosis not present

## 2013-02-20 DIAGNOSIS — M779 Enthesopathy, unspecified: Secondary | ICD-10-CM | POA: Diagnosis not present

## 2013-02-20 DIAGNOSIS — M2010 Hallux valgus (acquired), unspecified foot: Secondary | ICD-10-CM | POA: Diagnosis not present

## 2013-03-07 DIAGNOSIS — Z23 Encounter for immunization: Secondary | ICD-10-CM | POA: Diagnosis not present

## 2013-03-14 ENCOUNTER — Other Ambulatory Visit (HOSPITAL_BASED_OUTPATIENT_CLINIC_OR_DEPARTMENT_OTHER): Payer: Medicare Other | Admitting: Lab

## 2013-03-14 ENCOUNTER — Ambulatory Visit (HOSPITAL_BASED_OUTPATIENT_CLINIC_OR_DEPARTMENT_OTHER): Payer: Medicare Other | Admitting: Hematology & Oncology

## 2013-03-14 ENCOUNTER — Ambulatory Visit (HOSPITAL_BASED_OUTPATIENT_CLINIC_OR_DEPARTMENT_OTHER): Payer: Medicare Other

## 2013-03-14 VITALS — BP 152/93 | HR 73

## 2013-03-14 VITALS — BP 143/84 | HR 60 | Temp 97.7°F | Resp 18 | Ht 72.0 in | Wt 223.0 lb

## 2013-03-14 DIAGNOSIS — D509 Iron deficiency anemia, unspecified: Secondary | ICD-10-CM

## 2013-03-14 DIAGNOSIS — D751 Secondary polycythemia: Secondary | ICD-10-CM

## 2013-03-14 LAB — CBC WITH DIFFERENTIAL (CANCER CENTER ONLY)
BASO#: 0 10*3/uL (ref 0.0–0.2)
BASO%: 0.5 % (ref 0.0–2.0)
EOS%: 2.3 % (ref 0.0–7.0)
HGB: 16.4 g/dL (ref 13.0–17.1)
LYMPH#: 1.7 10*3/uL (ref 0.9–3.3)
MCHC: 36.1 g/dL — ABNORMAL HIGH (ref 32.0–35.9)
MONO#: 0.6 10*3/uL (ref 0.1–0.9)
NEUT#: 3.6 10*3/uL (ref 1.5–6.5)
RDW: 13 % (ref 11.1–15.7)
WBC: 6.1 10*3/uL (ref 4.0–10.0)

## 2013-03-14 LAB — IRON AND TIBC CHCC
Iron: 95 ug/dL (ref 42–163)
TIBC: 288 ug/dL (ref 202–409)

## 2013-03-14 NOTE — Progress Notes (Signed)
Gary Carpenter presents today for phlebotomy per MD orders. Phlebotomy procedure started at 1115 and ended at 1130. 500 grams removed per [facility] RN Patient observed for 30 minutes after procedure without any incident. Patient tolerated procedure well. IV needle removed intact.

## 2013-03-14 NOTE — Progress Notes (Signed)
Gary Carpenter presents today for phlebotomy per MD orders. Phlebotomy procedure started at 1125 and ended at 1132. 500 grams removed. Patient observed for 30 minutes after procedure without any incident. Patient tolerated procedure well. IV needle removed intact.

## 2013-03-14 NOTE — Patient Instructions (Signed)
Therapeutic Phlebotomy Therapeutic phlebotomy is the controlled removal of blood from your body for the purpose of treating a medical condition. It is similar to donating blood. Usually, about a pint (470 mL) of blood is removed. The average adult has 9 to 12 pints (4.3 to 5.7 L) of blood. Therapeutic phlebotomy may be used to treat the following medical conditions:  Hemochromatosis. This is a condition in which there is too much iron in the blood.  Polycythemia vera. This is a condition in which there are too many red cells in the blood.  Porphyria cutanea tarda. This is a disease usually passed from one generation to the next (inherited). It is a condition in which an important part of hemoglobin is not made properly. This results in the build up of abnormal amounts of porphyrins in the body.  Sickle cell disease. This is an inherited disease. It is a condition in which the red blood cells form an abnormal crescent shape rather than a round shape. LET YOUR CAREGIVER KNOW ABOUT:  Allergies.  Medicines taken including herbs, eyedrops, over-the-counter medicines, and creams.  Use of steroids (by mouth or creams).  Previous problems with anesthetics or numbing medicine.  History of blood clots.  History of bleeding or blood problems.  Previous surgery.  Possibility of pregnancy, if this applies. RISKS AND COMPLICATIONS This is a simple and safe procedure. Problems are unlikely. However, problems can occur and may include:  Nausea or lightheadedness.  Low blood pressure.  Soreness, bleeding, swelling, or bruising at the needle insertion site.  Infection. BEFORE THE PROCEDURE  This is a procedure that can be done as an outpatient. Confirm the time that you need to arrive for your procedure. Confirm whether there is a need to fast or withhold any medications. It is helpful to wear clothing with sleeves that can be raised above the elbow. A blood sample may be done to determine the  amount of red blood cells or iron in your blood. Plan ahead of time to have someone drive you home after the procedure. PROCEDURE The entire procedure from preparation through recovery takes about 1 hour. The actual collection takes about 10 to 15 minutes.  A needle will be inserted into your vein.  Tubing and a collection bag will be attached to that needle.  Blood will flow through the needle and tubing into the collection bag.  You may be asked to open and close your hand slowly and continuously during the entire collection.  Once the specified amount of blood has been removed from your body, the collection bag and tubing will be clamped.  The needle will be removed.  Pressure will be held on the site of the needle insertion to stop the bleeding. Then a bandage will be placed over the needle insertion site. AFTER THE PROCEDURE  Your recovery will be assessed and monitored. If there are no problems, as an outpatient, you should be able to go home shortly after the procedure.  Document Released: 10/04/2010 Document Revised: 07/25/2011 Document Reviewed: 10/04/2010 [employer] Patient Information 2014 [employer].  

## 2013-03-14 NOTE — Progress Notes (Signed)
This office note has been dictated.

## 2013-03-15 NOTE — Progress Notes (Signed)
CC:   Soyla Murphy. [name], M.D.  DIAGNOSIS:  Spurious polycythemia.  CURRENT THERAPY:  Phlebotomy to maintain hematocrit less than 45%.  INTERIM HISTORY:  Gary Carpenter comes in for followup.  We last saw him back in May.  He has had no problems over the summertime.  He will be going to [place] in a couple of weeks.  He has had no headache.  He has had no change in medications.  There have been no fevers, sweats, or chills.  He has had no cough or shortness of breath.  He does take 1 baby aspirin a day, which I tried to mention previously.  We are watching his iron studies.  Last iron studies done back in December of last year showed a ferritin of 149 with an iron saturation of 29%.  PHYSICAL EXAM:  General:  This is a fairly well-developed, well- nourished white gentleman in no obvious distress.  Vital signs: Temperature of 97.7, pulse 60, respiratory rate 18, blood pressure 143/84.  Weight is 223 pounds.  Head and Neck:  Normocephalic, atraumatic skull.  There are no ocular or oral lesions.  He has no facial plethora.  There is no conjunctival inflammation.  Lungs:  Clear bilaterally.  Cardiac:  Regular rate and rhythm with a normal S1 and S2. He has no murmurs, rubs, or bruits.  Abdomen:  Soft.  He has good bowel sounds.  There is no fluid wave.  There is no palpable abdominal mass. There is no palpable hepatosplenomegaly.  Back:  No tenderness over the spine, ribs, or hips.  Extremities:  No clubbing, cyanosis, or edema. Neurological:  No focal neurological deficits.  Skin:  No rashes, ecchymoses, or petechiae.  LABORATORY STUDIES:  White cell count is 6.1, hemoglobin 16.4, hematocrit 45.4, platelet count 144.  MCV is 87.  IMPRESSION:  Gary Carpenter is a nice 72 year old gentleman with spurious polycythemia.  This is related to his blood pressure.  He probably has some contracted plasma volume.  We will go ahead and phlebotomize him.  I feel better having him phlebotomized before he  goes on his trip over to [place].  I want to make sure that we try to minimize blood viscosity so he has a minimal risk of thromboembolic disease.  I will plan to get him back to see me in another 6 months.  I forgot to mention that his ferritin was down to 100 with an iron saturation of 33%.  As such, we are getting the iron out of him and decreasing his ability to make erythrocytes.    ______________________________ Josph Macho, M.D. PRE/MEDQ  D:  03/14/2013  T:  03/15/2013  Job:  [id number]

## 2013-04-30 DIAGNOSIS — Z85828 Personal history of other malignant neoplasm of skin: Secondary | ICD-10-CM | POA: Diagnosis not present

## 2013-04-30 DIAGNOSIS — L723 Sebaceous cyst: Secondary | ICD-10-CM | POA: Diagnosis not present

## 2013-09-12 ENCOUNTER — Other Ambulatory Visit (HOSPITAL_BASED_OUTPATIENT_CLINIC_OR_DEPARTMENT_OTHER): Payer: Medicare Other | Admitting: Lab

## 2013-09-12 ENCOUNTER — Ambulatory Visit (HOSPITAL_BASED_OUTPATIENT_CLINIC_OR_DEPARTMENT_OTHER): Payer: Medicare Other | Admitting: Hematology & Oncology

## 2013-09-12 ENCOUNTER — Ambulatory Visit (HOSPITAL_BASED_OUTPATIENT_CLINIC_OR_DEPARTMENT_OTHER): Payer: Medicare Other

## 2013-09-12 ENCOUNTER — Encounter: Payer: Self-pay | Admitting: Hematology & Oncology

## 2013-09-12 VITALS — BP 136/76 | HR 84 | Temp 97.6°F | Resp 18 | Ht 74.0 in | Wt 224.0 lb

## 2013-09-12 VITALS — BP 128/75 | HR 100 | Temp 98.0°F | Resp 16

## 2013-09-12 DIAGNOSIS — D509 Iron deficiency anemia, unspecified: Secondary | ICD-10-CM

## 2013-09-12 DIAGNOSIS — D751 Secondary polycythemia: Secondary | ICD-10-CM

## 2013-09-12 LAB — CBC WITH DIFFERENTIAL (CANCER CENTER ONLY)
BASO#: 0 10*3/uL (ref 0.0–0.2)
BASO%: 0.5 % (ref 0.0–2.0)
EOS%: 1.8 % (ref 0.0–7.0)
Eosinophils Absolute: 0.1 10*3/uL (ref 0.0–0.5)
HCT: 47.1 % (ref 38.7–49.9)
HGB: 17.5 g/dL — ABNORMAL HIGH (ref 13.0–17.1)
LYMPH#: 2 10*3/uL (ref 0.9–3.3)
LYMPH%: 30.2 % (ref 14.0–48.0)
MCH: 32.3 pg (ref 28.0–33.4)
MCHC: 37.2 g/dL — ABNORMAL HIGH (ref 32.0–35.9)
MCV: 87 fL (ref 82–98)
MONO#: 0.7 10*3/uL (ref 0.1–0.9)
MONO%: 10 % (ref 0.0–13.0)
NEUT#: 3.7 10*3/uL (ref 1.5–6.5)
NEUT%: 57.5 % (ref 40.0–80.0)
Platelets: 156 10*3/uL (ref 145–400)
RBC: 5.42 10*6/uL (ref 4.20–5.70)
RDW: 12.9 % (ref 11.1–15.7)
WBC: 6.5 10*3/uL (ref 4.0–10.0)

## 2013-09-12 NOTE — Progress Notes (Signed)
Hematology and Oncology Follow Up Visit  Gary Carpenter [id number] 03-02-1941 73 y.o. 09/12/2013   Principle Diagnosis:   Spurious polycythemia  Current Therapy:    Phlebotomy to maintain hematocrit less than 45%  Aspirin 81 mg by mouth daily     Interim History:  [name] is back for followup. Last him back in October. Since then he's been doing quite well. I do think we phlebotomize him back in. He was pretty close to 45% with his hematocrit.  He's had no problem headache. He's had no nausea vomiting. There's been no skin itching. He has had no change in bowel or bladder habits. He's had no leg swelling. There's been no rashes.  His been no change in medications.  We last saw him, his ferritin was 100 with an iron saturation 33%.    Medications: Current outpatient prescriptions:aspirin 81 MG tablet, Take 81 mg by mouth daily.  , Disp: , Rfl: ;  fish oil-omega-3 fatty acids 1000 MG capsule, Take 1 g by mouth daily.  , Disp: , Rfl: ;  losartan-hydrochlorothiazide (HYZAAR) 100-25 MG per tablet, Take 1 tablet by mouth daily. , Disp: , Rfl: ;  tadalafil (CIALIS) 10 MG tablet, Take 10 mg by mouth daily as needed.  , Disp: , Rfl:   Allergies: No Known Allergies  Past Medical History, Surgical history, Social history, and Family History were reviewed and updated.  Review of Systems: As above  Physical Exam:  height is 6\' 2"  (1.88 m) and weight is 224 lb (101.606 kg). His oral temperature is 97.6 F (36.4 C). His blood pressure is 136/76 and his pulse is 84. His respiration is 18.   Lungs are clear. Cardiac exam regular in rhythm. Is a 1/6 systolic murmur. Abdomen soft. No palpable liver or spleen. Exam no tenderness. Extremities no clubbing cyanosis or edema. Skin exam no rashes. Neurological exam nonfocal.  Lab Results  Component Value Date   WBC 6.5 09/12/2013   HGB 17.5* 09/12/2013   HCT 47.1 09/12/2013   MCV 87 09/12/2013   PLT 156 09/12/2013     Chemistry       Component Value Date/Time   NA 140 10/05/2009 1327   K 4.0 10/05/2009 1327   CL 104 10/05/2009 1327   CO2 23 10/05/2009 1327   BUN 13 10/05/2009 1327   CREATININE 0.74 10/05/2009 1327      Component Value Date/Time   CALCIUM 9.5 10/05/2009 1327   ALKPHOS 63 10/05/2009 1327   AST 21 10/05/2009 1327   ALT 25 10/05/2009 1327   BILITOT 0.8 10/05/2009 1327         Impression and Plan: Gary Carpenter is a 73 year old gentleman. Just spurious polycythemia. However, we are keeping his human hematocrit below 45%. This does make him feel better.  We will go ahead phlebotomize him today.  I think we will probably get him back in 6 months.   Volanda Napoleon, MD 4/30/20153:08 PM

## 2013-09-12 NOTE — Patient Instructions (Signed)

## 2013-09-13 LAB — FERRITIN CHCC: Ferritin: 89 ng/ml (ref 22–316)

## 2013-09-13 LAB — IRON AND TIBC CHCC
%SAT: 37 % (ref 20–55)
Iron: 111 ug/dL (ref 42–163)
TIBC: 301 ug/dL (ref 202–409)
UIBC: 190 ug/dL (ref 117–376)

## 2013-09-30 DIAGNOSIS — E119 Type 2 diabetes mellitus without complications: Secondary | ICD-10-CM | POA: Diagnosis not present

## 2013-09-30 DIAGNOSIS — I10 Essential (primary) hypertension: Secondary | ICD-10-CM | POA: Diagnosis not present

## 2013-09-30 DIAGNOSIS — E7800 Pure hypercholesterolemia, unspecified: Secondary | ICD-10-CM | POA: Diagnosis not present

## 2013-09-30 DIAGNOSIS — Z125 Encounter for screening for malignant neoplasm of prostate: Secondary | ICD-10-CM | POA: Diagnosis not present

## 2013-09-30 DIAGNOSIS — Z0000 Encounter for general adult medical examination without abnormal findings: Secondary | ICD-10-CM | POA: Diagnosis not present

## 2013-09-30 DIAGNOSIS — Z7982 Long term (current) use of aspirin: Secondary | ICD-10-CM | POA: Diagnosis not present

## 2013-10-03 DIAGNOSIS — E119 Type 2 diabetes mellitus without complications: Secondary | ICD-10-CM | POA: Diagnosis not present

## 2013-10-03 DIAGNOSIS — Z1212 Encounter for screening for malignant neoplasm of rectum: Secondary | ICD-10-CM | POA: Diagnosis not present

## 2013-10-03 DIAGNOSIS — Z0000 Encounter for general adult medical examination without abnormal findings: Secondary | ICD-10-CM | POA: Diagnosis not present

## 2013-10-03 DIAGNOSIS — E7800 Pure hypercholesterolemia, unspecified: Secondary | ICD-10-CM | POA: Diagnosis not present

## 2013-10-03 DIAGNOSIS — Z23 Encounter for immunization: Secondary | ICD-10-CM | POA: Diagnosis not present

## 2013-10-03 DIAGNOSIS — I10 Essential (primary) hypertension: Secondary | ICD-10-CM | POA: Diagnosis not present

## 2013-10-21 DIAGNOSIS — E119 Type 2 diabetes mellitus without complications: Secondary | ICD-10-CM | POA: Diagnosis not present

## 2013-12-09 DIAGNOSIS — IMO0002 Reserved for concepts with insufficient information to code with codable children: Secondary | ICD-10-CM | POA: Diagnosis not present

## 2014-01-12 DIAGNOSIS — M1710 Unilateral primary osteoarthritis, unspecified knee: Secondary | ICD-10-CM | POA: Diagnosis not present

## 2014-01-16 DIAGNOSIS — E119 Type 2 diabetes mellitus without complications: Secondary | ICD-10-CM | POA: Diagnosis not present

## 2014-01-16 DIAGNOSIS — E7800 Pure hypercholesterolemia, unspecified: Secondary | ICD-10-CM | POA: Diagnosis not present

## 2014-01-16 DIAGNOSIS — I10 Essential (primary) hypertension: Secondary | ICD-10-CM | POA: Diagnosis not present

## 2014-01-16 DIAGNOSIS — IMO0002 Reserved for concepts with insufficient information to code with codable children: Secondary | ICD-10-CM | POA: Diagnosis not present

## 2014-01-16 DIAGNOSIS — Z8719 Personal history of other diseases of the digestive system: Secondary | ICD-10-CM | POA: Diagnosis not present

## 2014-01-22 ENCOUNTER — Encounter: Payer: Self-pay | Admitting: Internal Medicine

## 2014-01-24 DIAGNOSIS — M9420 Chondromalacia, unspecified site: Secondary | ICD-10-CM | POA: Diagnosis not present

## 2014-01-24 DIAGNOSIS — IMO0002 Reserved for concepts with insufficient information to code with codable children: Secondary | ICD-10-CM | POA: Diagnosis not present

## 2014-01-24 DIAGNOSIS — Y929 Unspecified place or not applicable: Secondary | ICD-10-CM | POA: Diagnosis not present

## 2014-01-24 DIAGNOSIS — S83289A Other tear of lateral meniscus, current injury, unspecified knee, initial encounter: Secondary | ICD-10-CM | POA: Diagnosis not present

## 2014-01-24 DIAGNOSIS — Y999 Unspecified external cause status: Secondary | ICD-10-CM | POA: Diagnosis not present

## 2014-01-24 DIAGNOSIS — M2240 Chondromalacia patellae, unspecified knee: Secondary | ICD-10-CM | POA: Diagnosis not present

## 2014-01-24 DIAGNOSIS — X500XXA Overexertion from strenuous movement or load, initial encounter: Secondary | ICD-10-CM | POA: Diagnosis not present

## 2014-01-24 DIAGNOSIS — G8918 Other acute postprocedural pain: Secondary | ICD-10-CM | POA: Diagnosis not present

## 2014-03-12 ENCOUNTER — Other Ambulatory Visit (HOSPITAL_BASED_OUTPATIENT_CLINIC_OR_DEPARTMENT_OTHER): Payer: Medicare Other | Admitting: Lab

## 2014-03-12 ENCOUNTER — Ambulatory Visit: Payer: Medicare Other

## 2014-03-12 ENCOUNTER — Ambulatory Visit (HOSPITAL_BASED_OUTPATIENT_CLINIC_OR_DEPARTMENT_OTHER): Payer: Medicare Other | Admitting: Hematology & Oncology

## 2014-03-12 ENCOUNTER — Encounter: Payer: Self-pay | Admitting: Hematology & Oncology

## 2014-03-12 VITALS — BP 144/88 | HR 77 | Temp 97.7°F | Resp 16 | Wt 219.0 lb

## 2014-03-12 DIAGNOSIS — D751 Secondary polycythemia: Secondary | ICD-10-CM | POA: Diagnosis not present

## 2014-03-12 DIAGNOSIS — D509 Iron deficiency anemia, unspecified: Secondary | ICD-10-CM

## 2014-03-12 LAB — CBC WITH DIFFERENTIAL (CANCER CENTER ONLY)
BASO#: 0 10*3/uL (ref 0.0–0.2)
BASO%: 0.4 % (ref 0.0–2.0)
EOS%: 1.9 % (ref 0.0–7.0)
Eosinophils Absolute: 0.1 10*3/uL (ref 0.0–0.5)
HCT: 46.5 % (ref 38.7–49.9)
HEMOGLOBIN: 17.3 g/dL — AB (ref 13.0–17.1)
LYMPH#: 2.1 10*3/uL (ref 0.9–3.3)
LYMPH%: 29.9 % (ref 14.0–48.0)
MCH: 32.5 pg (ref 28.0–33.4)
MCHC: 37.2 g/dL — ABNORMAL HIGH (ref 32.0–35.9)
MCV: 87 fL (ref 82–98)
MONO#: 0.7 10*3/uL (ref 0.1–0.9)
MONO%: 10.6 % (ref 0.0–13.0)
NEUT%: 57.2 % (ref 40.0–80.0)
NEUTROS ABS: 3.9 10*3/uL (ref 1.5–6.5)
Platelets: 174 10*3/uL (ref 145–400)
RBC: 5.32 10*6/uL (ref 4.20–5.70)
RDW: 12.8 % (ref 11.1–15.7)
WBC: 6.9 10*3/uL (ref 4.0–10.0)

## 2014-03-12 LAB — CHCC SATELLITE - SMEAR

## 2014-03-12 NOTE — Progress Notes (Signed)
Hematology and Oncology Follow Up Visit  Gary Carpenter [id number] [birth year] 73 y.o. 03/12/2014   Principle Diagnosis:   Spurious polycythemia  Current Therapy:    Phlebotomy to maintain hematocrit less than 45%  Aspirin 81 mg by mouth daily     Interim History:  Mr.  Gary Carpenter is back for followup. He had a good summer. Unfortunately, he had arthroscopic surgery for his left knee. This is probably a month ago. He has some ligament issues. He is still try placing golf.  He's had no problem headache. He's had no nausea or vomiting. There's been no skin itching. He has had no change in bowel or bladder habits. There's been no rashes.  His been no change in medications.  We last saw him, his ferritin was 89 with an iron saturation 37 %.    Medications: Current outpatient prescriptions:aspirin 81 MG tablet, Take 81 mg by mouth daily.  , Disp: , Rfl: ;  fish oil-omega-3 fatty acids 1000 MG capsule, Take 1 g by mouth daily.  , Disp: , Rfl: ;  losartan-hydrochlorothiazide (HYZAAR) 100-25 MG per tablet, Take 1 tablet by mouth daily. , Disp: , Rfl: ;  tadalafil (CIALIS) 10 MG tablet, Take 10 mg by mouth daily as needed.  , Disp: , Rfl:   Allergies: No Known Allergies  Past Medical History, Surgical history, Social history, and Family History were reviewed and updated.  Review of Systems: As above  Physical Exam:  weight is 219 lb (99.338 kg). His oral temperature is 97.7 F (36.5 C). His blood pressure is 144/88 and his pulse is 77. His respiration is 16.   Lungs are clear. Cardiac exam regular in rhythm. Is a 1/6 systolic murmur. Abdomen soft. No palpable liver or spleen. Exam no tenderness. Extremities no clubbing cyanosis or edema. Skin exam no rashes. Neurological exam nonfocal.  Lab Results  Component Value Date   WBC 6.9 03/12/2014   HGB 17.3* 03/12/2014   HCT 46.5 03/12/2014   MCV 87 03/12/2014   PLT 174 03/12/2014     Chemistry      Component Value Date/Time   NA  140 10/05/2009 1327   K 4.0 10/05/2009 1327   CL 104 10/05/2009 1327   CO2 23 10/05/2009 1327   BUN 13 10/05/2009 1327   CREATININE 0.74 10/05/2009 1327      Component Value Date/Time   CALCIUM 9.5 10/05/2009 1327   ALKPHOS 63 10/05/2009 1327   AST 21 10/05/2009 1327   ALT 25 10/05/2009 1327   BILITOT 0.8 10/05/2009 1327         Impression and Plan: Gary Carpenter is a 73 year old gentleman. He has spurious polycythemia. However, we are keeping his human hematocrit below 45%. This does make him feel better.  We will go ahead phlebotomize him today.  I think we will probably get him back in 6 months.   Volanda Napoleon, MD 10/28/20154:35 PM

## 2014-03-12 NOTE — Progress Notes (Signed)
Gary Carpenter presents today for phlebotomy per MD orders. Phlebotomy procedure started at 1450 and ended at 1500. 500 ml removed. Patient observed for 30 minutes after procedure without any incident. Patient tolerated procedure well. IV needle removed intact.

## 2014-03-12 NOTE — Patient Instructions (Signed)

## 2014-03-13 LAB — IRON AND TIBC CHCC
%SAT: 36 % (ref 20–55)
IRON: 104 ug/dL (ref 42–163)
TIBC: 288 ug/dL (ref 202–409)
UIBC: 184 ug/dL (ref 117–376)

## 2014-03-13 LAB — FERRITIN CHCC: FERRITIN: 136 ng/mL (ref 22–316)

## 2014-03-17 DIAGNOSIS — H2513 Age-related nuclear cataract, bilateral: Secondary | ICD-10-CM | POA: Diagnosis not present

## 2014-03-20 DIAGNOSIS — E118 Type 2 diabetes mellitus with unspecified complications: Secondary | ICD-10-CM | POA: Diagnosis not present

## 2014-03-20 DIAGNOSIS — E780 Pure hypercholesterolemia: Secondary | ICD-10-CM | POA: Diagnosis not present

## 2014-04-21 ENCOUNTER — Other Ambulatory Visit: Payer: Self-pay | Admitting: Dermatology

## 2014-04-21 DIAGNOSIS — L720 Epidermal cyst: Secondary | ICD-10-CM | POA: Diagnosis not present

## 2014-04-21 DIAGNOSIS — Z85828 Personal history of other malignant neoplasm of skin: Secondary | ICD-10-CM | POA: Diagnosis not present

## 2014-04-21 DIAGNOSIS — L570 Actinic keratosis: Secondary | ICD-10-CM | POA: Diagnosis not present

## 2014-04-21 DIAGNOSIS — L821 Other seborrheic keratosis: Secondary | ICD-10-CM | POA: Diagnosis not present

## 2014-04-21 DIAGNOSIS — L820 Inflamed seborrheic keratosis: Secondary | ICD-10-CM | POA: Diagnosis not present

## 2014-04-21 DIAGNOSIS — D485 Neoplasm of uncertain behavior of skin: Secondary | ICD-10-CM | POA: Diagnosis not present

## 2014-06-18 ENCOUNTER — Other Ambulatory Visit: Payer: Self-pay | Admitting: Family

## 2014-09-10 ENCOUNTER — Encounter: Payer: Self-pay | Admitting: Hematology & Oncology

## 2014-09-10 ENCOUNTER — Ambulatory Visit (HOSPITAL_BASED_OUTPATIENT_CLINIC_OR_DEPARTMENT_OTHER): Payer: Medicare Other | Admitting: Nurse Practitioner

## 2014-09-10 ENCOUNTER — Ambulatory Visit (HOSPITAL_BASED_OUTPATIENT_CLINIC_OR_DEPARTMENT_OTHER): Payer: Medicare Other | Admitting: Hematology & Oncology

## 2014-09-10 VITALS — BP 141/81 | HR 73 | Temp 97.8°F | Resp 18 | Ht 74.0 in | Wt 217.0 lb

## 2014-09-10 DIAGNOSIS — D751 Secondary polycythemia: Secondary | ICD-10-CM | POA: Diagnosis not present

## 2014-09-10 LAB — CBC WITH DIFFERENTIAL (CANCER CENTER ONLY)
BASO#: 0 10*3/uL (ref 0.0–0.2)
BASO%: 0.4 % (ref 0.0–2.0)
EOS%: 1.9 % (ref 0.0–7.0)
Eosinophils Absolute: 0.1 10*3/uL (ref 0.0–0.5)
HCT: 45.2 % (ref 38.7–49.9)
HGB: 16.5 g/dL (ref 13.0–17.1)
LYMPH#: 1.3 10*3/uL (ref 0.9–3.3)
LYMPH%: 17.7 % (ref 14.0–48.0)
MCH: 32.1 pg (ref 28.0–33.4)
MCHC: 36.5 g/dL — ABNORMAL HIGH (ref 32.0–35.9)
MCV: 88 fL (ref 82–98)
MONO#: 0.7 10*3/uL (ref 0.1–0.9)
MONO%: 9.8 % (ref 0.0–13.0)
NEUT#: 5.3 10*3/uL (ref 1.5–6.5)
NEUT%: 70.2 % (ref 40.0–80.0)
PLATELETS: 171 10*3/uL (ref 145–400)
RBC: 5.14 10*6/uL (ref 4.20–5.70)
RDW: 13.1 % (ref 11.1–15.7)
WBC: 7.6 10*3/uL (ref 4.0–10.0)

## 2014-09-10 LAB — CMP (CANCER CENTER ONLY)
ALBUMIN: 3.9 g/dL (ref 3.3–5.5)
ALT: 37 U/L (ref 10–47)
AST: 39 U/L — ABNORMAL HIGH (ref 11–38)
Alkaline Phosphatase: 61 U/L (ref 26–84)
BILIRUBIN TOTAL: 1.3 mg/dL (ref 0.20–1.60)
BUN, Bld: 16 mg/dL (ref 7–22)
CO2: 31 mEq/L (ref 18–33)
Calcium: 10.1 mg/dL (ref 8.0–10.3)
Chloride: 101 mEq/L (ref 98–108)
Creat: 1 mg/dl (ref 0.6–1.2)
Glucose, Bld: 169 mg/dL — ABNORMAL HIGH (ref 73–118)
Potassium: 3.9 mEq/L (ref 3.3–4.7)
SODIUM: 144 meq/L (ref 128–145)
TOTAL PROTEIN: 7.6 g/dL (ref 6.4–8.1)

## 2014-09-10 LAB — IRON AND TIBC CHCC
%SAT: 32 % (ref 20–55)
IRON: 92 ug/dL (ref 42–163)
TIBC: 285 ug/dL (ref 202–409)
UIBC: 193 ug/dL (ref 117–376)

## 2014-09-10 LAB — FERRITIN CHCC: FERRITIN: 109 ng/mL (ref 22–316)

## 2014-09-10 NOTE — Progress Notes (Signed)
Hematology and Oncology Follow Up Visit  Nobel Brar [id number] 1940-07-15 74 y.o. 09/10/2014   Principle Diagnosis:   Spurious polycythemia  Current Therapy:    Phlebotomy to maintain hematocrit less than 45%  Aspirin 81 mg by mouth daily     Interim History:  Mr.  Keyworth is back for followup. He had a good winter. His knees are doing a lot better. He had arthroscopic surgery on his left knee. He is playing golf. He actually played golf yesterday.  He is not planning much for the summer. 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   He is not smoking. He is not drinking too much. He's had no nausea or vomiting. He's had no headache.  Medications:  Current outpatient prescriptions:  .  aspirin 81 MG tablet, Take 81 mg by mouth daily.  , Disp: , Rfl:  .  fish oil-omega-3 fatty acids 1000 MG capsule, Take 1 g by mouth daily.  , Disp: , Rfl:  .  losartan-hydrochlorothiazide (HYZAAR) 100-25 MG per tablet, Take 1 tablet by mouth daily. , Disp: , Rfl:  .  tadalafil (CIALIS) 10 MG tablet, Take 10 mg by mouth daily as needed.  , Disp: , Rfl:   Allergies: No Known Allergies  Past Medical History, Surgical history, Social history, and Family History were reviewed and updated.  Review of Systems: As above  Physical Exam:  height is 6\' 2"  (1.88 m) and weight is 217 lb (98.431 kg). His oral temperature is 97.8 F (36.6 C). His blood pressure is 141/81 and his pulse is 73. His respiration is 18.   Lungs are clear. Cardiac exam regular in rhythm. Is a 1/6 systolic murmur. Abdomen soft. No palpable liver or spleen. Exam no tenderness. Extremities no clubbing cyanosis or edema. Skin exam no rashes. Neurological exam nonfocal.  Lab Results  Component Value Date   WBC 7.6 09/10/2014   HGB 16.5 09/10/2014   HCT 45.2 09/10/2014   MCV 88 09/10/2014   PLT 171 09/10/2014     Chemistry      Component Value Date/Time   NA 144 09/10/2014 0831   NA 140 10/05/2009 1327   K 3.9 09/10/2014  0831   K 4.0 10/05/2009 1327   CL 101 09/10/2014 0831   CL 104 10/05/2009 1327   CO2 31 09/10/2014 0831   CO2 23 10/05/2009 1327   BUN 16 09/10/2014 0831   BUN 13 10/05/2009 1327   CREATININE 1.0 09/10/2014 0831   CREATININE 0.74 10/05/2009 1327      Component Value Date/Time   CALCIUM 10.1 09/10/2014 0831   CALCIUM 9.5 10/05/2009 1327   ALKPHOS 61 09/10/2014 0831   ALKPHOS 63 10/05/2009 1327   AST 39* 09/10/2014 0831   AST 21 10/05/2009 1327   ALT 37 09/10/2014 0831   ALT 25 10/05/2009 1327   BILITOT 1.30 09/10/2014 0831   BILITOT 0.8 10/05/2009 1327         Impression and Plan: Mr. Schalk is a 74 year old gentleman. He has spurious polycythemia. However, we are keeping his human hematocrit below 45%. This does make him feel better.  I think we can hold off on phlebotomize him. His hemoglobin is just above 45. He is doing well. He is on the aspirin. He is active. I'm sure that we will have to phlebotomize him next time we see him.  I think we will probably get him back in 6 months.   Volanda Napoleon, MD 4/27/20169:43 AM

## 2014-09-19 ENCOUNTER — Encounter: Payer: Self-pay | Admitting: Hematology & Oncology

## 2014-09-22 LAB — CALR MUTATAION(GENPATH)

## 2014-10-20 DIAGNOSIS — E119 Type 2 diabetes mellitus without complications: Secondary | ICD-10-CM | POA: Diagnosis not present

## 2014-10-20 DIAGNOSIS — Z0000 Encounter for general adult medical examination without abnormal findings: Secondary | ICD-10-CM | POA: Diagnosis not present

## 2014-10-20 DIAGNOSIS — I10 Essential (primary) hypertension: Secondary | ICD-10-CM | POA: Diagnosis not present

## 2014-10-20 DIAGNOSIS — E781 Pure hyperglyceridemia: Secondary | ICD-10-CM | POA: Diagnosis not present

## 2014-10-20 DIAGNOSIS — Z23 Encounter for immunization: Secondary | ICD-10-CM | POA: Diagnosis not present

## 2014-10-20 DIAGNOSIS — Z125 Encounter for screening for malignant neoplasm of prostate: Secondary | ICD-10-CM | POA: Diagnosis not present

## 2014-10-22 DIAGNOSIS — Z1212 Encounter for screening for malignant neoplasm of rectum: Secondary | ICD-10-CM | POA: Diagnosis not present

## 2014-10-22 DIAGNOSIS — I10 Essential (primary) hypertension: Secondary | ICD-10-CM | POA: Diagnosis not present

## 2014-10-22 DIAGNOSIS — E781 Pure hyperglyceridemia: Secondary | ICD-10-CM | POA: Diagnosis not present

## 2014-10-22 DIAGNOSIS — Z87891 Personal history of nicotine dependence: Secondary | ICD-10-CM | POA: Diagnosis not present

## 2014-10-22 DIAGNOSIS — E119 Type 2 diabetes mellitus without complications: Secondary | ICD-10-CM | POA: Diagnosis not present

## 2014-10-29 DIAGNOSIS — E781 Pure hyperglyceridemia: Secondary | ICD-10-CM | POA: Diagnosis not present

## 2014-12-02 ENCOUNTER — Encounter: Payer: Self-pay | Admitting: Internal Medicine

## 2014-12-08 DIAGNOSIS — E781 Pure hyperglyceridemia: Secondary | ICD-10-CM | POA: Diagnosis not present

## 2014-12-08 DIAGNOSIS — I10 Essential (primary) hypertension: Secondary | ICD-10-CM | POA: Diagnosis not present

## 2014-12-08 DIAGNOSIS — E119 Type 2 diabetes mellitus without complications: Secondary | ICD-10-CM | POA: Diagnosis not present

## 2015-02-25 ENCOUNTER — Telehealth: Payer: Self-pay | Admitting: Hematology & Oncology

## 2015-02-25 NOTE — Telephone Encounter (Signed)
Md will be out of office on 03/11/15.  appt was cx and resch for 03/27/15.  Patient was called and there was no answer, i left a detailed message and asked the patient to call back.  appt calendar was mailed out to patient's home

## 2015-03-11 ENCOUNTER — Other Ambulatory Visit: Payer: Medicare Other

## 2015-03-11 ENCOUNTER — Telehealth: Payer: Self-pay | Admitting: Hematology & Oncology

## 2015-03-11 ENCOUNTER — Ambulatory Visit: Payer: Medicare Other | Admitting: Hematology & Oncology

## 2015-03-11 NOTE — Telephone Encounter (Signed)
Patient called on Tuesday 03/10/2015 at 3:59pm and left a message wanting to reschedule an appt. I called patient today (Wednesday, 03/11/2015 at 9:24am) and l/m for patient to return my call.       [facility].

## 2015-03-24 ENCOUNTER — Other Ambulatory Visit: Payer: Self-pay | Admitting: *Deleted

## 2015-03-24 DIAGNOSIS — D751 Secondary polycythemia: Secondary | ICD-10-CM

## 2015-03-25 ENCOUNTER — Ambulatory Visit (HOSPITAL_BASED_OUTPATIENT_CLINIC_OR_DEPARTMENT_OTHER): Payer: Medicare Other | Admitting: Hematology & Oncology

## 2015-03-25 ENCOUNTER — Other Ambulatory Visit (HOSPITAL_BASED_OUTPATIENT_CLINIC_OR_DEPARTMENT_OTHER): Payer: Medicare Other

## 2015-03-25 ENCOUNTER — Encounter: Payer: Self-pay | Admitting: Hematology & Oncology

## 2015-03-25 ENCOUNTER — Ambulatory Visit (HOSPITAL_BASED_OUTPATIENT_CLINIC_OR_DEPARTMENT_OTHER): Payer: Medicare Other

## 2015-03-25 VITALS — BP 143/90 | HR 61 | Temp 97.2°F | Resp 14 | Ht 74.0 in | Wt 220.0 lb

## 2015-03-25 DIAGNOSIS — D751 Secondary polycythemia: Secondary | ICD-10-CM | POA: Diagnosis not present

## 2015-03-25 DIAGNOSIS — D508 Other iron deficiency anemias: Secondary | ICD-10-CM

## 2015-03-25 DIAGNOSIS — Z7982 Long term (current) use of aspirin: Secondary | ICD-10-CM | POA: Diagnosis not present

## 2015-03-25 LAB — CBC WITH DIFFERENTIAL (CANCER CENTER ONLY)
BASO#: 0 10*3/uL (ref 0.0–0.2)
BASO%: 0.5 % (ref 0.0–2.0)
EOS ABS: 0.1 10*3/uL (ref 0.0–0.5)
EOS%: 2.5 % (ref 0.0–7.0)
HCT: 47.3 % (ref 38.7–49.9)
HGB: 17.1 g/dL (ref 13.0–17.1)
LYMPH#: 1.6 10*3/uL (ref 0.9–3.3)
LYMPH%: 28.1 % (ref 14.0–48.0)
MCH: 31.8 pg (ref 28.0–33.4)
MCHC: 36.2 g/dL — AB (ref 32.0–35.9)
MCV: 88 fL (ref 82–98)
MONO#: 0.6 10*3/uL (ref 0.1–0.9)
MONO%: 10.7 % (ref 0.0–13.0)
NEUT%: 58.2 % (ref 40.0–80.0)
NEUTROS ABS: 3.3 10*3/uL (ref 1.5–6.5)
PLATELETS: 158 10*3/uL (ref 145–400)
RBC: 5.38 10*6/uL (ref 4.20–5.70)
RDW: 12.9 % (ref 11.1–15.7)
WBC: 5.7 10*3/uL (ref 4.0–10.0)

## 2015-03-25 LAB — COMPREHENSIVE METABOLIC PANEL (CC13)
ALBUMIN: 3.8 g/dL (ref 3.5–5.0)
ALK PHOS: 80 U/L (ref 40–150)
ALT: 43 U/L (ref 0–55)
ANION GAP: 13 meq/L — AB (ref 3–11)
AST: 34 U/L (ref 5–34)
BILIRUBIN TOTAL: 0.77 mg/dL (ref 0.20–1.20)
BUN: 18 mg/dL (ref 7.0–26.0)
CALCIUM: 9.7 mg/dL (ref 8.4–10.4)
CO2: 23 mEq/L (ref 22–29)
Chloride: 102 mEq/L (ref 98–109)
Creatinine: 1 mg/dL (ref 0.7–1.3)
EGFR: 77 mL/min/{1.73_m2} — AB (ref >90)
GLUCOSE: 184 mg/dL — AB (ref 70–140)
POTASSIUM: 4.1 meq/L (ref 3.5–5.1)
Sodium: 138 mEq/L (ref 136–145)
Total Protein: 7.1 g/dL (ref 6.4–8.3)

## 2015-03-25 LAB — IRON AND TIBC CHCC
%SAT: 31 % (ref 20–55)
IRON: 85 ug/dL (ref 42–163)
TIBC: 278 ug/dL (ref 202–409)
UIBC: 192 ug/dL (ref 117–376)

## 2015-03-25 LAB — FERRITIN CHCC: FERRITIN: 91 ng/mL (ref 22–316)

## 2015-03-25 NOTE — Progress Notes (Signed)
Hematology and Oncology Follow Up Visit  Gary Carpenter [id number] 12/30/1940 74 y.o. 03/25/2015   Principle Diagnosis:   Spurious polycythemia  Current Therapy:    Phlebotomy to maintain hematocrit less than 45%  Aspirin 81 mg by mouth daily     Interim History:  [name] is back for followup. He is doing pretty well. He had a good summer. He is not planning on any traveling over the holidays.  He's had no problems with headache. He's had no nausea or vomiting. He's had no change in bowel or bladder habits. He's had no leg swelling. He's had no pruritus.  Is not been a change in his medications.  His last iron studies done back in April showed a ferritin of 109 with iron saturation of 32%.  Overall, his performance status is ECOG 0.   Medications:  Current outpatient prescriptions:  .  aspirin 81 MG tablet, Take 81 mg by mouth daily.  , Disp: , Rfl:  .  fish oil-omega-3 fatty acids 1000 MG capsule, Take 1 g by mouth daily.  , Disp: , Rfl:  .  losartan-hydrochlorothiazide (HYZAAR) 100-25 MG per tablet, Take 1 tablet by mouth daily. , Disp: , Rfl:  .  tadalafil (CIALIS) 10 MG tablet, Take 10 mg by mouth daily as needed.  , Disp: , Rfl:   Allergies: No Known Allergies  Past Medical History, Surgical history, Social history, and Family History were reviewed and updated.  Review of Systems: As above  Physical Exam:  height is 6\' 2"  (1.88 m) and weight is 220 lb (99.791 kg). His oral temperature is 97.2 F (36.2 C). His blood pressure is 143/90 and his pulse is 61. His respiration is 14.   Well-developed and well-nourished white male in no obvious distress. Head and neck exam shows no ocular or oral lesions. He has no scleral icterus. He has no conjunctival inflammation. He does have a little bit of facial plethora. There is no adenopathy in the neck. Lungs are clear. Cardiac exam regular rate and rhythm. He has a 1/6 systolic murmur. Abdomen is soft. There is no fluid  wave. He has no guarding or rebound tenderness. There is no palpable liver or spleen. Back exam shows no tenderness over the spine, ribs or hips.. Extremities shows no clubbing cyanosis or edema. Skin exam shows no rashes, ecchymoses or petechia. Neurological exam is nonfocal.  Lab Results  Component Value Date   WBC 5.7 03/25/2015   HGB 17.1 03/25/2015   HCT 47.3 03/25/2015   MCV 88 03/25/2015   PLT 158 03/25/2015     Chemistry      Component Value Date/Time   NA 144 09/10/2014 0831   NA 140 10/05/2009 1327   K 3.9 09/10/2014 0831   K 4.0 10/05/2009 1327   CL 101 09/10/2014 0831   CL 104 10/05/2009 1327   CO2 31 09/10/2014 0831   CO2 23 10/05/2009 1327   BUN 16 09/10/2014 0831   BUN 13 10/05/2009 1327   CREATININE 1.0 09/10/2014 0831   CREATININE 0.74 10/05/2009 1327      Component Value Date/Time   CALCIUM 10.1 09/10/2014 0831   CALCIUM 9.5 10/05/2009 1327   ALKPHOS 61 09/10/2014 0831   ALKPHOS 63 10/05/2009 1327   AST 39* 09/10/2014 0831   AST 21 10/05/2009 1327   ALT 37 09/10/2014 0831   ALT 25 10/05/2009 1327   BILITOT 1.30 09/10/2014 0831   BILITOT 0.8 10/05/2009 1327  Impression and Plan: Gary Carpenter is a 74 year old gentleman. He has spurious polycythemia. However, we are keeping his human hematocrit below 45%. This does make him feel better.  I think we can  phlebotomize him. We have not had to phlebotomize him for probably a year. He does feel a little better after being phlebotomized.  For now, we'll plan to get him back in another 6 months.  He is taking his aspirin.   Gary Napoleon, MD 11/9/20169:01 AM

## 2015-03-25 NOTE — Patient Instructions (Signed)
Therapeutic Phlebotomy, Care After  Refer to this sheet in the next few weeks. These instructions provide you with information about caring for yourself after your procedure. Your health care provider may also give you more specific instructions. Your treatment has been planned according to current medical practices, but problems sometimes occur. Call your health care provider if you have any problems or questions after your procedure.  WHAT TO EXPECT AFTER THE PROCEDURE  After your procedure, it is common to have:   Light-headedness or dizziness. You may feel faint.   Nausea.   Tiredness.  HOME CARE INSTRUCTIONS  Activities   Return to your normal activities as directed by your health care provider. Most people can go back to their normal activities right away.   Avoid strenuous physical activity and heavy lifting or pulling for about 5 hours after the procedure. Do not lift anything that is heavier than 10 lb (4.5 kg).   Athletes should avoid strenuous exercise for at least 12 hours.   Change positions slowly for the remainder of the day. This will help to prevent light-headedness or fainting.   If you feel light-headed, lie down until the feeling goes away.  Eating and Drinking   Be sure to eat well-balanced meals for the next 24 hours.   Drink enough fluid to keep your urine clear or pale yellow.   Avoid drinking alcohol on the day that you had the procedure.  Care of the Needle Insertion Site   Keep your bandage dry. You can remove the bandage after about 5 hours or as directed by your health care provider.   If you have bleeding from the needle insertion site, elevate your arm and press firmly on the site until the bleeding stops.   If you have bruising at the site, apply ice to the area:   Put ice in a plastic bag.   Place a towel between your skin and the bag.   Leave the ice on for 20 minutes, 2-3 times a day for the first 24 hours.   If the swelling does not go away after 24 hours, apply  a warm, moist washcloth to the area for 20 minutes, 2-3 times a day.  General Instructions   Avoid smoking for at least 30 minutes after the procedure.   Keep all follow-up visits as directed by your health care provider. It is important to continue with further therapeutic phlebotomy treatments as directed.  SEEK MEDICAL CARE IF:   You have redness, swelling, or pain at the needle insertion site.   You have fluid, blood, or pus coming from the needle insertion site.   You feel light-headed, dizzy, or nauseated, and the feeling does not go away.   You notice new bruising at the needle insertion site.   You feel weaker than normal.   You have a fever or chills.  SEEK IMMEDIATE MEDICAL CARE IF:   You have severe nausea or vomiting.   You have chest pain.   You have trouble breathing.    This information is not intended to replace advice given to you by your health care provider. Make sure you discuss any questions you have with your health care provider.    Document Released: 10/04/2010 Document Revised: 09/16/2014 Document Reviewed: 04/28/2014  Elsevier Interactive Patient Education 2016 [employer]

## 2015-03-27 ENCOUNTER — Other Ambulatory Visit: Payer: Medicare Other

## 2015-03-27 ENCOUNTER — Ambulatory Visit: Payer: Medicare Other | Admitting: Hematology & Oncology

## 2015-09-21 ENCOUNTER — Ambulatory Visit (HOSPITAL_BASED_OUTPATIENT_CLINIC_OR_DEPARTMENT_OTHER): Payer: Medicare Other

## 2015-09-21 ENCOUNTER — Other Ambulatory Visit (HOSPITAL_BASED_OUTPATIENT_CLINIC_OR_DEPARTMENT_OTHER): Payer: Medicare Other

## 2015-09-21 ENCOUNTER — Encounter: Payer: Self-pay | Admitting: Hematology & Oncology

## 2015-09-21 ENCOUNTER — Ambulatory Visit (HOSPITAL_BASED_OUTPATIENT_CLINIC_OR_DEPARTMENT_OTHER): Payer: Medicare Other | Admitting: Hematology & Oncology

## 2015-09-21 VITALS — BP 142/75 | HR 62 | Temp 97.3°F | Resp 16 | Ht 74.0 in | Wt 222.0 lb

## 2015-09-21 DIAGNOSIS — D508 Other iron deficiency anemias: Secondary | ICD-10-CM

## 2015-09-21 DIAGNOSIS — D751 Secondary polycythemia: Secondary | ICD-10-CM

## 2015-09-21 LAB — CBC WITH DIFFERENTIAL (CANCER CENTER ONLY)
BASO#: 0 10*3/uL (ref 0.0–0.2)
BASO%: 0.6 % (ref 0.0–2.0)
EOS ABS: 0.2 10*3/uL (ref 0.0–0.5)
EOS%: 2.8 % (ref 0.0–7.0)
HCT: 47.4 % (ref 38.7–49.9)
HEMOGLOBIN: 17.3 g/dL — AB (ref 13.0–17.1)
LYMPH#: 1.6 10*3/uL (ref 0.9–3.3)
LYMPH%: 30.2 % (ref 14.0–48.0)
MCH: 32.5 pg (ref 28.0–33.4)
MCHC: 36.5 g/dL — ABNORMAL HIGH (ref 32.0–35.9)
MCV: 89 fL (ref 82–98)
MONO#: 0.7 10*3/uL (ref 0.1–0.9)
MONO%: 13 % (ref 0.0–13.0)
NEUT#: 2.9 10*3/uL (ref 1.5–6.5)
NEUT%: 53.4 % (ref 40.0–80.0)
PLATELETS: 167 10*3/uL (ref 145–400)
RBC: 5.33 10*6/uL (ref 4.20–5.70)
RDW: 13 % (ref 11.1–15.7)
WBC: 5.4 10*3/uL (ref 4.0–10.0)

## 2015-09-21 LAB — IRON AND TIBC
%SAT: 30 % (ref 20–55)
IRON: 94 ug/dL (ref 42–163)
TIBC: 308 ug/dL (ref 202–409)
UIBC: 214 ug/dL (ref 117–376)

## 2015-09-21 LAB — COMPREHENSIVE METABOLIC PANEL
ALBUMIN: 4 g/dL (ref 3.5–5.0)
ALK PHOS: 76 U/L (ref 40–150)
ALT: 44 U/L (ref 0–55)
ANION GAP: 8 meq/L (ref 3–11)
AST: 34 U/L (ref 5–34)
BILIRUBIN TOTAL: 0.85 mg/dL (ref 0.20–1.20)
BUN: 12.2 mg/dL (ref 7.0–26.0)
CO2: 27 mEq/L (ref 22–29)
Calcium: 9.8 mg/dL (ref 8.4–10.4)
Chloride: 103 mEq/L (ref 98–109)
Creatinine: 1 mg/dL (ref 0.7–1.3)
EGFR: 76 mL/min/{1.73_m2} — AB (ref >90)
Glucose: 199 mg/dl — ABNORMAL HIGH (ref 70–140)
Potassium: 4.4 mEq/L (ref 3.5–5.1)
Sodium: 139 mEq/L (ref 136–145)
TOTAL PROTEIN: 7.4 g/dL (ref 6.4–8.3)

## 2015-09-21 LAB — FERRITIN: Ferritin: 91 ng/ml (ref 22–316)

## 2015-09-21 NOTE — Patient Instructions (Signed)

## 2015-09-21 NOTE — Progress Notes (Signed)
Hematology and Oncology Follow Up Visit  Gary Carpenter [id number] 11-16-1940 75 y.o. 09/21/2015   Principle Diagnosis:   Spurious polycythemia  Current Therapy:    Phlebotomy to maintain hematocrit less than 45%  Aspirin 81 mg by mouth daily     Interim History:  [name] is back for followup. Gary Carpenter is doing okay. Gary Carpenter is planning quite a lot of golf. Gary Carpenter does look quite plethoric. Gary Carpenter was last phlebotomized back in November.  Gary Carpenter is not smoking. Gary Carpenter has occasional [employer] beverage.  Gary Carpenter's had no headache. Gary Carpenter's had no visual problems. Gary Carpenter's had no cough. There's been no nausea or vomiting.  Gary Carpenter's had no leg swelling.  His last iron studies showed a ferritin of 91 with an iron saturation of 31%.   Overall, his performance status is ECOG 0.   Medications:  Current outpatient prescriptions:  .  aspirin 81 MG tablet, Take 81 mg by mouth daily.  , Disp: , Rfl:  .  fish oil-omega-3 fatty acids 1000 MG capsule, Take 1 g by mouth daily.  , Disp: , Rfl:  .  losartan-hydrochlorothiazide (HYZAAR) 100-25 MG per tablet, Take 1 tablet by mouth daily. , Disp: , Rfl:  .  tadalafil (CIALIS) 10 MG tablet, Take 10 mg by mouth daily as needed.  , Disp: , Rfl:   Allergies: No Known Allergies  Past Medical History, Surgical history, Social history, and Family History were reviewed and updated.  Review of Systems: As above  Physical Exam:  height is 6\' 2"  (1.88 m) and weight is 222 lb (100.699 kg). His oral temperature is 97.3 F (36.3 C). His blood pressure is 142/75 and his pulse is 62. His respiration is 16.   Well-developed and well-nourished white male in no obvious distress. Head and neck exam shows no ocular or oral lesions. Gary Carpenter has no scleral icterus. Gary Carpenter has no conjunctival inflammation. Gary Carpenter does have a little bit of facial plethora. There is no adenopathy in the neck. Lungs are clear. Cardiac exam regular rate and rhythm. Gary Carpenter has a 1/6 systolic murmur. Abdomen is soft. There is no fluid wave. Gary Carpenter  has no guarding or rebound tenderness. There is no palpable liver or spleen. Back exam shows no tenderness over the spine, ribs or hips.. Extremities shows no clubbing cyanosis or edema. Skin exam shows no rashes, ecchymoses or petechia. Neurological exam is nonfocal.  Lab Results  Component Value Date   WBC 5.4 09/21/2015   HGB 17.3* 09/21/2015   HCT 47.4 09/21/2015   MCV 89 09/21/2015   PLT 167 09/21/2015     Chemistry      Component Value Date/Time   NA 138 03/25/2015 0806   NA 144 09/10/2014 0831   NA 140 10/05/2009 1327   K 4.1 03/25/2015 0806   K 3.9 09/10/2014 0831   K 4.0 10/05/2009 1327   CL 101 09/10/2014 0831   CL 104 10/05/2009 1327   CO2 23 03/25/2015 0806   CO2 31 09/10/2014 0831   CO2 23 10/05/2009 1327   BUN 18.0 03/25/2015 0806   BUN 16 09/10/2014 0831   BUN 13 10/05/2009 1327   CREATININE 1.0 03/25/2015 0806   CREATININE 1.0 09/10/2014 0831   CREATININE 0.74 10/05/2009 1327      Component Value Date/Time   CALCIUM 9.7 03/25/2015 0806   CALCIUM 10.1 09/10/2014 0831   CALCIUM 9.5 10/05/2009 1327   ALKPHOS 80 03/25/2015 0806   ALKPHOS 61 09/10/2014 0831   ALKPHOS 63 10/05/2009 1327  AST 34 03/25/2015 0806   AST 39* 09/10/2014 0831   AST 21 10/05/2009 1327   ALT 43 03/25/2015 0806   ALT 37 09/10/2014 0831   ALT 25 10/05/2009 1327   BILITOT 0.77 03/25/2015 0806   BILITOT 1.30 09/10/2014 0831   BILITOT 0.8 10/05/2009 1327         Impression and Plan: Gary Carpenter is a 75 year old gentleman. Gary Carpenter has spurious polycythemia. However, we are keeping his hematocrit below 45%. This does make him feel better.  I think we Need to phlebotomize him. Gary Carpenter really looks like Gary Carpenter has too much blood. Gary Carpenter is quite plethoric.  We will plan to get him back in another 6 months.  Gary Carpenter is taking his aspirin.   Volanda Napoleon, MD 5/8/20179:27 AM are in

## 2015-09-22 ENCOUNTER — Other Ambulatory Visit: Payer: Medicare Other

## 2015-10-21 DIAGNOSIS — E119 Type 2 diabetes mellitus without complications: Secondary | ICD-10-CM | POA: Diagnosis not present

## 2015-10-21 DIAGNOSIS — I10 Essential (primary) hypertension: Secondary | ICD-10-CM | POA: Diagnosis not present

## 2015-10-21 DIAGNOSIS — Z125 Encounter for screening for malignant neoplasm of prostate: Secondary | ICD-10-CM | POA: Diagnosis not present

## 2015-10-28 DIAGNOSIS — Z7982 Long term (current) use of aspirin: Secondary | ICD-10-CM | POA: Diagnosis not present

## 2015-10-28 DIAGNOSIS — E119 Type 2 diabetes mellitus without complications: Secondary | ICD-10-CM | POA: Diagnosis not present

## 2015-10-28 DIAGNOSIS — Z87891 Personal history of nicotine dependence: Secondary | ICD-10-CM | POA: Diagnosis not present

## 2015-10-28 DIAGNOSIS — I10 Essential (primary) hypertension: Secondary | ICD-10-CM | POA: Diagnosis not present

## 2015-11-25 ENCOUNTER — Encounter: Payer: Self-pay | Admitting: Internal Medicine

## 2016-01-11 DIAGNOSIS — L821 Other seborrheic keratosis: Secondary | ICD-10-CM | POA: Diagnosis not present

## 2016-01-11 DIAGNOSIS — D1801 Hemangioma of skin and subcutaneous tissue: Secondary | ICD-10-CM | POA: Diagnosis not present

## 2016-01-11 DIAGNOSIS — L720 Epidermal cyst: Secondary | ICD-10-CM | POA: Diagnosis not present

## 2016-01-11 DIAGNOSIS — Z85828 Personal history of other malignant neoplasm of skin: Secondary | ICD-10-CM | POA: Diagnosis not present

## 2016-01-11 DIAGNOSIS — C44319 Basal cell carcinoma of skin of other parts of face: Secondary | ICD-10-CM | POA: Diagnosis not present

## 2016-01-11 DIAGNOSIS — C44311 Basal cell carcinoma of skin of nose: Secondary | ICD-10-CM | POA: Diagnosis not present

## 2016-01-11 DIAGNOSIS — L570 Actinic keratosis: Secondary | ICD-10-CM | POA: Diagnosis not present

## 2016-01-11 DIAGNOSIS — D485 Neoplasm of uncertain behavior of skin: Secondary | ICD-10-CM | POA: Diagnosis not present

## 2016-01-11 DIAGNOSIS — L738 Other specified follicular disorders: Secondary | ICD-10-CM | POA: Diagnosis not present

## 2016-01-22 DIAGNOSIS — E119 Type 2 diabetes mellitus without complications: Secondary | ICD-10-CM | POA: Diagnosis not present

## 2016-01-25 DIAGNOSIS — C44311 Basal cell carcinoma of skin of nose: Secondary | ICD-10-CM | POA: Diagnosis not present

## 2016-01-25 DIAGNOSIS — Z85828 Personal history of other malignant neoplasm of skin: Secondary | ICD-10-CM | POA: Diagnosis not present

## 2016-02-01 DIAGNOSIS — I10 Essential (primary) hypertension: Secondary | ICD-10-CM | POA: Diagnosis not present

## 2016-02-01 DIAGNOSIS — E119 Type 2 diabetes mellitus without complications: Secondary | ICD-10-CM | POA: Diagnosis not present

## 2016-03-14 DIAGNOSIS — H52223 Regular astigmatism, bilateral: Secondary | ICD-10-CM | POA: Diagnosis not present

## 2016-03-14 DIAGNOSIS — H26053 Posterior subcapsular polar infantile and juvenile cataract, bilateral: Secondary | ICD-10-CM | POA: Diagnosis not present

## 2016-03-14 DIAGNOSIS — H5213 Myopia, bilateral: Secondary | ICD-10-CM | POA: Diagnosis not present

## 2016-03-14 DIAGNOSIS — H2513 Age-related nuclear cataract, bilateral: Secondary | ICD-10-CM | POA: Diagnosis not present

## 2016-03-14 DIAGNOSIS — H524 Presbyopia: Secondary | ICD-10-CM | POA: Diagnosis not present

## 2016-03-28 ENCOUNTER — Ambulatory Visit (HOSPITAL_BASED_OUTPATIENT_CLINIC_OR_DEPARTMENT_OTHER): Payer: Medicare Other | Admitting: Hematology & Oncology

## 2016-03-28 ENCOUNTER — Ambulatory Visit (HOSPITAL_BASED_OUTPATIENT_CLINIC_OR_DEPARTMENT_OTHER): Payer: Medicare Other

## 2016-03-28 ENCOUNTER — Encounter: Payer: Self-pay | Admitting: Hematology & Oncology

## 2016-03-28 ENCOUNTER — Other Ambulatory Visit (HOSPITAL_BASED_OUTPATIENT_CLINIC_OR_DEPARTMENT_OTHER): Payer: Medicare Other

## 2016-03-28 VITALS — BP 128/57 | HR 57 | Resp 17

## 2016-03-28 VITALS — BP 141/72 | HR 57 | Temp 97.2°F | Wt 219.8 lb

## 2016-03-28 DIAGNOSIS — D751 Secondary polycythemia: Secondary | ICD-10-CM

## 2016-03-28 DIAGNOSIS — D509 Iron deficiency anemia, unspecified: Secondary | ICD-10-CM | POA: Diagnosis not present

## 2016-03-28 DIAGNOSIS — D508 Other iron deficiency anemias: Secondary | ICD-10-CM

## 2016-03-28 LAB — COMPREHENSIVE METABOLIC PANEL
ALT: 34 U/L (ref 0–55)
AST: 23 U/L (ref 5–34)
Albumin: 3.7 g/dL (ref 3.5–5.0)
Alkaline Phosphatase: 78 U/L (ref 40–150)
Anion Gap: 10 mEq/L (ref 3–11)
BUN: 16.4 mg/dL (ref 7.0–26.0)
CALCIUM: 9.7 mg/dL (ref 8.4–10.4)
CHLORIDE: 102 meq/L (ref 98–109)
CO2: 25 meq/L (ref 22–29)
CREATININE: 1 mg/dL (ref 0.7–1.3)
EGFR: 73 mL/min/{1.73_m2} — ABNORMAL LOW (ref >90)
GLUCOSE: 198 mg/dL — AB (ref 70–140)
POTASSIUM: 4.2 meq/L (ref 3.5–5.1)
SODIUM: 137 meq/L (ref 136–145)
Total Bilirubin: 1.14 mg/dL (ref 0.20–1.20)
Total Protein: 7.3 g/dL (ref 6.4–8.3)

## 2016-03-28 LAB — CBC WITH DIFFERENTIAL (CANCER CENTER ONLY)
BASO#: 0 10*3/uL (ref 0.0–0.2)
BASO%: 0.4 % (ref 0.0–2.0)
EOS%: 2.4 % (ref 0.0–7.0)
Eosinophils Absolute: 0.1 10*3/uL (ref 0.0–0.5)
HEMATOCRIT: 46.1 % (ref 38.7–49.9)
HGB: 17.1 g/dL (ref 13.0–17.1)
LYMPH#: 1.9 10*3/uL (ref 0.9–3.3)
LYMPH%: 34.1 % (ref 14.0–48.0)
MCH: 32.7 pg (ref 28.0–33.4)
MCHC: 37.1 g/dL — AB (ref 32.0–35.9)
MCV: 88 fL (ref 82–98)
MONO#: 0.6 10*3/uL (ref 0.1–0.9)
MONO%: 11.4 % (ref 0.0–13.0)
NEUT#: 2.8 10*3/uL (ref 1.5–6.5)
NEUT%: 51.7 % (ref 40.0–80.0)
PLATELETS: 159 10*3/uL (ref 145–400)
RBC: 5.23 10*6/uL (ref 4.20–5.70)
RDW: 13.1 % (ref 11.1–15.7)
WBC: 5.5 10*3/uL (ref 4.0–10.0)

## 2016-03-28 LAB — IRON AND TIBC
%SAT: 40 % (ref 20–55)
IRON: 123 ug/dL (ref 42–163)
TIBC: 305 ug/dL (ref 202–409)
UIBC: 182 ug/dL (ref 117–376)

## 2016-03-28 LAB — TECHNOLOGIST REVIEW CHCC SATELLITE

## 2016-03-28 LAB — FERRITIN: FERRITIN: 74 ng/mL (ref 22–316)

## 2016-03-28 NOTE — Progress Notes (Signed)
Therapeutic phlebotomy performed with a 20G IV catheter in R AC, beginning at 0955 and completed at 1007 yielding 500g .  Pt tolerated well. Declined diet offered but was provided a beverage. He feels well and agreed to stay for 20 mins of observation.

## 2016-03-28 NOTE — Patient Instructions (Signed)

## 2016-03-28 NOTE — Progress Notes (Signed)
Hematology and Oncology Follow Up Visit  Gary Carpenter [id number] 1940/10/16 75 y.o. 03/28/2016   Principle Diagnosis:   Spurious polycythemia  Current Therapy:    Phlebotomy to maintain hematocrit less than 45%  Aspirin 81 mg by mouth daily     Interim History:  [name] is back for followup. We see him twice year. He had no problems over the summertime. For him, he was pretty relaxed over the summer. He did not go overseas. 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   He is looking forward to Thanksgiving. 60 people will be coming over for Thanksgiving dinner. He looks forward to this. I  We last saw him, his ferritin was 90 his iron saturation was 30%.   He is not smoking. He is on a great job in cutting out tobacco use.   He has had no headache. He's had no fatigue or weakness. He's had no change in bowel or bladder habits.    Overall, his performance status is ECOG 0.   Medications:  Current Outpatient Prescriptions:  .  aspirin 81 MG tablet, Take 81 mg by mouth daily.  , Disp: , Rfl:  .  fish oil-omega-3 fatty acids 1000 MG capsule, Take 1 g by mouth daily.  , Disp: , Rfl:  .  losartan-hydrochlorothiazide (HYZAAR) 100-25 MG per tablet, Take 1 tablet by mouth daily. , Disp: , Rfl:  .  tadalafil (CIALIS) 10 MG tablet, Take 10 mg by mouth daily as needed.  , Disp: , Rfl:   Allergies: No Known Allergies  Past Medical History, Surgical history, Social history, and Family History were reviewed and updated.  Review of Systems: As above  Physical Exam:  weight is 219 lb 12.8 oz (99.7 kg). His oral temperature is 97.2 F (36.2 C). His blood pressure is 141/72 (abnormal) and his pulse is 57 (abnormal).   Well-developed and well-nourished white male in no obvious distress. Head and neck exam shows no ocular or oral lesions. He has no scleral icterus. He has no conjunctival inflammation. He does have a little bit of facial  plethora. There is no adenopathy in the neck. Lungs are clear. Cardiac exam regular rate and rhythm. He has a 1/6 systolic murmur. Abdomen is soft. There is no fluid wave. He has no guarding or rebound tenderness. There is no palpable liver or spleen. Back exam shows no tenderness over the spine, ribs or hips.. Extremities shows no clubbing cyanosis or edema. Skin exam shows no rashes, ecchymoses or petechia. Neurological exam is nonfocal.  Lab Results  Component Value Date   WBC 5.5 03/28/2016   HGB 17.1 03/28/2016   HCT 46.1 03/28/2016   MCV 88 03/28/2016   PLT 159 03/28/2016     Chemistry      Component Value Date/Time   NA 139 09/21/2015 0845   K 4.4 09/21/2015 0845   CL 101 09/10/2014 0831   CO2 27 09/21/2015 0845   BUN 12.2 09/21/2015 0845   CREATININE 1.0 09/21/2015 0845      Component Value Date/Time   CALCIUM 9.8 09/21/2015 0845   ALKPHOS 76 09/21/2015 0845   AST 34 09/21/2015 0845   ALT 44 09/21/2015 0845   BILITOT 0.85 09/21/2015 0845         Impression and Plan: Gary Carpenter is a 75 year old gentleman. He has spurious polycythemia. However, we are keeping his hematocrit below 45%. This does make him feel better.  I will go ahead and phlebotomize him today. This will definitely get [place] through the winter time until we see him back in the spring.  I know that he will be busy come December. I want him feeling well. Volanda Napoleon, MD 11/13/20179:43 AM

## 2016-05-02 DIAGNOSIS — H01009 Unspecified blepharitis unspecified eye, unspecified eyelid: Secondary | ICD-10-CM | POA: Diagnosis not present

## 2016-05-02 DIAGNOSIS — H25013 Cortical age-related cataract, bilateral: Secondary | ICD-10-CM | POA: Diagnosis not present

## 2016-05-02 DIAGNOSIS — H2511 Age-related nuclear cataract, right eye: Secondary | ICD-10-CM | POA: Diagnosis not present

## 2016-05-02 DIAGNOSIS — H2513 Age-related nuclear cataract, bilateral: Secondary | ICD-10-CM | POA: Diagnosis not present

## 2016-05-16 DIAGNOSIS — H269 Unspecified cataract: Secondary | ICD-10-CM

## 2016-05-16 HISTORY — PX: CATARACT EXTRACTION, BILATERAL: SHX1313

## 2016-05-16 HISTORY — DX: Unspecified cataract: H26.9

## 2016-07-12 DIAGNOSIS — H52223 Regular astigmatism, bilateral: Secondary | ICD-10-CM | POA: Diagnosis not present

## 2016-07-12 DIAGNOSIS — H5203 Hypermetropia, bilateral: Secondary | ICD-10-CM | POA: Diagnosis not present

## 2016-07-12 DIAGNOSIS — H2511 Age-related nuclear cataract, right eye: Secondary | ICD-10-CM | POA: Diagnosis not present

## 2016-07-19 DIAGNOSIS — H2512 Age-related nuclear cataract, left eye: Secondary | ICD-10-CM | POA: Diagnosis not present

## 2016-09-19 DIAGNOSIS — E119 Type 2 diabetes mellitus without complications: Secondary | ICD-10-CM | POA: Diagnosis not present

## 2016-09-26 ENCOUNTER — Other Ambulatory Visit (HOSPITAL_BASED_OUTPATIENT_CLINIC_OR_DEPARTMENT_OTHER): Payer: Medicare Other

## 2016-09-26 ENCOUNTER — Ambulatory Visit (HOSPITAL_BASED_OUTPATIENT_CLINIC_OR_DEPARTMENT_OTHER): Payer: Medicare Other | Admitting: Hematology & Oncology

## 2016-09-26 VITALS — BP 143/94 | HR 75 | Temp 97.7°F | Resp 18 | Wt 221.0 lb

## 2016-09-26 DIAGNOSIS — D509 Iron deficiency anemia, unspecified: Secondary | ICD-10-CM

## 2016-09-26 DIAGNOSIS — D751 Secondary polycythemia: Secondary | ICD-10-CM

## 2016-09-26 LAB — CMP (CANCER CENTER ONLY)
ALK PHOS: 78 U/L (ref 26–84)
ALT: 34 U/L (ref 10–47)
AST: 30 U/L (ref 11–38)
Albumin: 3.7 g/dL (ref 3.3–5.5)
BILIRUBIN TOTAL: 0.8 mg/dL (ref 0.20–1.60)
BUN: 11 mg/dL (ref 7–22)
CALCIUM: 9.6 mg/dL (ref 8.0–10.3)
CO2: 28 meq/L (ref 18–33)
CREATININE: 0.9 mg/dL (ref 0.6–1.2)
Chloride: 105 mEq/L (ref 98–108)
GLUCOSE: 196 mg/dL — AB (ref 73–118)
Potassium: 4 mEq/L (ref 3.3–4.7)
SODIUM: 137 meq/L (ref 128–145)
Total Protein: 7 g/dL (ref 6.4–8.1)

## 2016-09-26 LAB — IRON AND TIBC
%SAT: 23 % (ref 20–55)
Iron: 68 ug/dL (ref 42–163)
TIBC: 295 ug/dL (ref 202–409)
UIBC: 227 ug/dL (ref 117–376)

## 2016-09-26 LAB — CBC WITH DIFFERENTIAL (CANCER CENTER ONLY)
BASO#: 0 10*3/uL (ref 0.0–0.2)
BASO%: 0.5 % (ref 0.0–2.0)
EOS%: 2.4 % (ref 0.0–7.0)
Eosinophils Absolute: 0.1 10*3/uL (ref 0.0–0.5)
HEMATOCRIT: 46.4 % (ref 38.7–49.9)
HGB: 17 g/dL (ref 13.0–17.1)
LYMPH#: 1.6 10*3/uL (ref 0.9–3.3)
LYMPH%: 27.3 % (ref 14.0–48.0)
MCH: 32.6 pg (ref 28.0–33.4)
MCHC: 36.6 g/dL — AB (ref 32.0–35.9)
MCV: 89 fL (ref 82–98)
MONO#: 0.6 10*3/uL (ref 0.1–0.9)
MONO%: 10.1 % (ref 0.0–13.0)
NEUT%: 59.7 % (ref 40.0–80.0)
NEUTROS ABS: 3.4 10*3/uL (ref 1.5–6.5)
Platelets: 160 10*3/uL (ref 145–400)
RBC: 5.22 10*6/uL (ref 4.20–5.70)
RDW: 12.6 % (ref 11.1–15.7)
WBC: 5.8 10*3/uL (ref 4.0–10.0)

## 2016-09-26 LAB — FERRITIN: Ferritin: 66 ng/ml (ref 22–316)

## 2016-09-26 NOTE — Progress Notes (Signed)
Hematology and Oncology Follow Up Visit  Gary Carpenter [id number] [birth year] 76 y.o. 09/26/2016   Principle Diagnosis:   Spurious polycythemia  Current Therapy:    Phlebotomy to maintain hematocrit less than 45%  Aspirin 81 mg by mouth daily     Interim History:  [name] is back for followup. We see him twice year. He had no problems over the winter and spring. 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   He does well with phlebotomies. We will go ahead and phlebotomize him today.   He's had no headache. He is not smoking. He's had no nausea or vomiting. He's had no cough. He's had no rashes.   Overall, his performance status is ECOG 0.   Medications:  Current Outpatient Prescriptions:  .  aspirin 81 MG tablet, Take 81 mg by mouth daily.  , Disp: , Rfl:  .  fish oil-omega-3 fatty acids 1000 MG capsule, Take 1 g by mouth daily.  , Disp: , Rfl:  .  losartan-hydrochlorothiazide (HYZAAR) 100-25 MG per tablet, Take 1 tablet by mouth daily. , Disp: , Rfl:  .  tadalafil (CIALIS) 10 MG tablet, Take 10 mg by mouth daily as needed.  , Disp: , Rfl:   Allergies: No Known Allergies  Past Medical History, Surgical history, Social history, and Family History were reviewed and updated.  Review of Systems: As above  Physical Exam:  weight is 221 lb (100.2 kg). His oral temperature is 97.7 F (36.5 C). His blood pressure is 143/94 (abnormal) and his pulse is 75. His respiration is 18 and oxygen saturation is 94%.   Well-developed and well-nourished white male in no obvious distress. Head and neck exam shows no ocular or oral lesions. He has no scleral icterus. He has no conjunctival inflammation. He does have a little bit of facial plethora. There is no adenopathy in the neck. Lungs are clear. Cardiac exam regular rate and rhythm. He has a 1/6 systolic murmur. Abdomen is soft. There is no fluid wave. He has no guarding or rebound tenderness. There is no palpable liver or  spleen. Back exam shows no tenderness over the spine, ribs or hips.. Extremities shows no clubbing cyanosis or edema. Skin exam shows no rashes, ecchymoses or petechia. Neurological exam is nonfocal.  Lab Results  Component Value Date   WBC 5.8 09/26/2016   HGB 17.0 09/26/2016   HCT 46.4 09/26/2016   MCV 89 09/26/2016   PLT 160 09/26/2016     Chemistry      Component Value Date/Time   NA 137 09/26/2016 0832   NA 137 03/28/2016 0841   K 4.0 09/26/2016 0832   K 4.2 03/28/2016 0841   CL 105 09/26/2016 0832   CO2 28 09/26/2016 0832   CO2 25 03/28/2016 0841   BUN 11 09/26/2016 0832   BUN 16.4 03/28/2016 0841   CREATININE 0.9 09/26/2016 0832   CREATININE 1.0 03/28/2016 0841      Component Value Date/Time   CALCIUM 9.6 09/26/2016 0832   CALCIUM 9.7 03/28/2016 0841   ALKPHOS 78 09/26/2016 0832   ALKPHOS 78 03/28/2016 0841   AST 30 09/26/2016 0832   AST 23 03/28/2016 0841   ALT 34 09/26/2016 0832   ALT 34 03/28/2016 0841   BILITOT 0.80 09/26/2016 0832   BILITOT 1.14 03/28/2016 0841         Impression and Plan: Gary Carpenter is a 76 year old gentleman. He has spurious polycythemia. However, we are keeping his  hematocrit below 45%. This does make him feel better.  I will go ahead and phlebotomize him today. This will definitely get [place] through the Summer. 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 He has a huge project that he has to deal with. This is going be great for the city of [place].  I will plan to see him back in 6 months.    5/14/20189:57 AM

## 2016-10-03 ENCOUNTER — Ambulatory Visit (HOSPITAL_BASED_OUTPATIENT_CLINIC_OR_DEPARTMENT_OTHER): Payer: Medicare Other

## 2016-10-03 VITALS — BP 134/77 | HR 90 | Temp 98.1°F | Resp 17

## 2016-10-03 DIAGNOSIS — D751 Secondary polycythemia: Secondary | ICD-10-CM

## 2016-10-03 NOTE — Progress Notes (Signed)
Gary Carpenter presents today for phlebotomy per MD orders. Phlebotomy procedure started at 1300 and ended at 1305 with 540  grams removed via 16 G R AC. Patient observed for 10 minutes after procedure without any incident- declined 30 mins observation. Patient tolerated procedure well.

## 2016-10-03 NOTE — Patient Instructions (Signed)

## 2016-10-26 DIAGNOSIS — Z125 Encounter for screening for malignant neoplasm of prostate: Secondary | ICD-10-CM | POA: Diagnosis not present

## 2016-10-26 DIAGNOSIS — I10 Essential (primary) hypertension: Secondary | ICD-10-CM | POA: Diagnosis not present

## 2016-10-26 DIAGNOSIS — E119 Type 2 diabetes mellitus without complications: Secondary | ICD-10-CM | POA: Diagnosis not present

## 2016-10-26 DIAGNOSIS — Z0000 Encounter for general adult medical examination without abnormal findings: Secondary | ICD-10-CM | POA: Diagnosis not present

## 2016-10-26 DIAGNOSIS — E7800 Pure hypercholesterolemia, unspecified: Secondary | ICD-10-CM | POA: Diagnosis not present

## 2016-10-26 DIAGNOSIS — E118 Type 2 diabetes mellitus with unspecified complications: Secondary | ICD-10-CM | POA: Diagnosis not present

## 2016-11-02 DIAGNOSIS — Z86010 Personal history of colonic polyps: Secondary | ICD-10-CM | POA: Diagnosis not present

## 2016-11-02 DIAGNOSIS — I10 Essential (primary) hypertension: Secondary | ICD-10-CM | POA: Diagnosis not present

## 2016-11-02 DIAGNOSIS — D751 Secondary polycythemia: Secondary | ICD-10-CM | POA: Diagnosis not present

## 2016-11-02 DIAGNOSIS — Z8711 Personal history of peptic ulcer disease: Secondary | ICD-10-CM | POA: Diagnosis not present

## 2017-03-29 ENCOUNTER — Encounter: Payer: Self-pay | Admitting: Hematology & Oncology

## 2017-03-29 ENCOUNTER — Ambulatory Visit: Payer: Medicare Other

## 2017-03-29 ENCOUNTER — Other Ambulatory Visit: Payer: Self-pay

## 2017-03-29 ENCOUNTER — Ambulatory Visit (HOSPITAL_BASED_OUTPATIENT_CLINIC_OR_DEPARTMENT_OTHER): Payer: Medicare Other | Admitting: Hematology & Oncology

## 2017-03-29 ENCOUNTER — Other Ambulatory Visit (HOSPITAL_BASED_OUTPATIENT_CLINIC_OR_DEPARTMENT_OTHER): Payer: Medicare Other

## 2017-03-29 VITALS — BP 143/82 | HR 70 | Temp 98.4°F | Resp 18 | Wt 218.0 lb

## 2017-03-29 DIAGNOSIS — D751 Secondary polycythemia: Secondary | ICD-10-CM

## 2017-03-29 LAB — CMP (CANCER CENTER ONLY)
ALK PHOS: 86 U/L — AB (ref 26–84)
ALT: 41 U/L (ref 10–47)
AST: 29 U/L (ref 11–38)
Albumin: 3.7 g/dL (ref 3.3–5.5)
BUN, Bld: 14 mg/dL (ref 7–22)
CALCIUM: 9.7 mg/dL (ref 8.0–10.3)
CO2: 30 meq/L (ref 18–33)
Chloride: 101 mEq/L (ref 98–108)
Creat: 0.8 mg/dl (ref 0.6–1.2)
GLUCOSE: 250 mg/dL — AB (ref 73–118)
POTASSIUM: 4 meq/L (ref 3.3–4.7)
Sodium: 143 mEq/L (ref 128–145)
Total Bilirubin: 1 mg/dl (ref 0.20–1.60)
Total Protein: 7 g/dL (ref 6.4–8.1)

## 2017-03-29 LAB — CBC WITH DIFFERENTIAL (CANCER CENTER ONLY)
BASO#: 0 10*3/uL (ref 0.0–0.2)
BASO%: 0.5 % (ref 0.0–2.0)
EOS%: 2.7 % (ref 0.0–7.0)
Eosinophils Absolute: 0.2 10*3/uL (ref 0.0–0.5)
HCT: 47.2 % (ref 38.7–49.9)
HGB: 17.2 g/dL — ABNORMAL HIGH (ref 13.0–17.1)
LYMPH#: 1.4 10*3/uL (ref 0.9–3.3)
LYMPH%: 25.2 % (ref 14.0–48.0)
MCH: 32.4 pg (ref 28.0–33.4)
MCHC: 36.4 g/dL — ABNORMAL HIGH (ref 32.0–35.9)
MCV: 88 fL (ref 82–98)
MONO#: 0.7 10*3/uL (ref 0.1–0.9)
MONO%: 11.9 % (ref 0.0–13.0)
NEUT#: 3.3 10*3/uL (ref 1.5–6.5)
NEUT%: 59.7 % (ref 40.0–80.0)
PLATELETS: 159 10*3/uL (ref 145–400)
RBC: 5.37 10*6/uL (ref 4.20–5.70)
RDW: 12.7 % (ref 11.1–15.7)
WBC: 5.5 10*3/uL (ref 4.0–10.0)

## 2017-03-29 LAB — IRON AND TIBC
%SAT: 35 % (ref 20–55)
Iron: 102 ug/dL (ref 42–163)
TIBC: 291 ug/dL (ref 202–409)
UIBC: 189 ug/dL (ref 117–376)

## 2017-03-29 LAB — FERRITIN: FERRITIN: 100 ng/mL (ref 22–316)

## 2017-03-29 NOTE — Progress Notes (Signed)
Missed phlebotomy attempt to right and left ac.  Patient refuses any more attempts for phlebotomy today and requests to come back on Friday for phlebotomy.  Pt instructed to increase water intake starting today.  Pt verbalizes an understanding of information and has no questions at this time.

## 2017-03-29 NOTE — Progress Notes (Signed)
Hematology and Oncology Follow Up Visit  Jasir Rother [id number] [birth year] 76 y.o. 03/29/2017   Principle Diagnosis:   Spurious polycythemia  Current Therapy:    Phlebotomy to maintain hematocrit less than 45%  Aspirin 81 mg by mouth daily     Interim History:  Mr.  Hopes is back for followup.  As always, he has been doing okay.  He will be traveling right after Thanksgiving.  He will be going to the [place].  He tends to take very nice trips as he knows quite a lot of people and usually gets invited to stay at their vacation homes.  He has had no health issues.  We usually phlebotomize him when we see him.  When we  last saw him in May, his iron studies showed a ferritin of 66 with a iron saturation of 23%.  He has had no issue with fevers.  He does have sinus issues.  He does have seasonal allergies.  He has had no bleeding.  He has had no change in bowel or bladder habits.  Overall, his performance status is ECOG 0.  Medications:  Current Outpatient Medications:  .  aspirin 81 MG tablet, Take 81 mg by mouth daily.  , Disp: , Rfl:  .  fish oil-omega-3 fatty acids 1000 MG capsule, Take 1 g by mouth daily.  , Disp: , Rfl:  .  losartan-hydrochlorothiazide (HYZAAR) 100-25 MG per tablet, Take 1 tablet by mouth daily. , Disp: , Rfl:  .  tadalafil (CIALIS) 10 MG tablet, Take 10 mg by mouth daily as needed.  , Disp: , Rfl:   Allergies: No Known Allergies  Past Medical History, Surgical history, Social history, and Family History were reviewed and updated.  Review of Systems: As stated in the interim history  Physical Exam:  weight is 218 lb (98.9 kg). His oral temperature is 98.4 F (36.9 C). His blood pressure is 143/82 (abnormal) and his pulse is 70. His respiration is 18 and oxygen saturation is 97%.   Well-developed well-nourished white male.  Head and neck exam shows no ocular or oral lesions.  There are no palpable cervical or supraclavicular lymph nodes.  Lungs  are clear bilaterally.  Cardiac exam regular rate and rhythm with no murmurs, rubs or bruits.  Abdomen is soft.  He has good bowel sounds.  There is no fluid wave.  There is no palpable liver or spleen tip.  Back exam shows no tenderness over the spine, ribs or hips.  Extremities shows no clubbing, cyanosis or edema.  Neurological exam shows no focal neurological deficit.  Skin exam shows some slight facial plethora.  He has a little bit of a ruddy complexion to his skin.    Lab Results  Component Value Date   WBC 5.5 03/29/2017   HGB 17.2 (H) 03/29/2017   HCT 47.2 03/29/2017   MCV 88 03/29/2017   PLT 159 03/29/2017     Chemistry      Component Value Date/Time   NA 137 09/26/2016 0832   NA 137 03/28/2016 0841   K 4.0 09/26/2016 0832   K 4.2 03/28/2016 0841   CL 105 09/26/2016 0832   CO2 28 09/26/2016 0832   CO2 25 03/28/2016 0841   BUN 11 09/26/2016 0832   BUN 16.4 03/28/2016 0841   CREATININE 0.9 09/26/2016 0832   CREATININE 1.0 03/28/2016 0841      Component Value Date/Time   CALCIUM 9.6 09/26/2016 0832   CALCIUM 9.7 03/28/2016 0841  ALKPHOS 78 09/26/2016 0832   ALKPHOS 78 03/28/2016 0841   AST 30 09/26/2016 0832   AST 23 03/28/2016 0841   ALT 34 09/26/2016 0832   ALT 34 03/28/2016 0841   BILITOT 0.80 09/26/2016 0832   BILITOT 1.14 03/28/2016 0841         Impression and Plan: Mr. Gastelum is a 76 year old gentleman. He has spurious polycythemia. However, we are keeping his hematocrit below 45%. This does make him feel better.  I will go ahead and phlebotomize him today.   As always, we will see him back in 6 months.  He always knows that he can come back sooner than 6 months if he has any issues.    11/14/20189:25 AM

## 2017-03-31 ENCOUNTER — Ambulatory Visit (HOSPITAL_BASED_OUTPATIENT_CLINIC_OR_DEPARTMENT_OTHER): Payer: Medicare Other

## 2017-03-31 DIAGNOSIS — D751 Secondary polycythemia: Secondary | ICD-10-CM | POA: Diagnosis not present

## 2017-03-31 NOTE — Progress Notes (Signed)
Pt refused to stay for 30 minutes post phlebotomy.  Pt without complaints at time of discharge.  

## 2017-03-31 NOTE — Patient Instructions (Signed)

## 2017-05-04 DIAGNOSIS — E1165 Type 2 diabetes mellitus with hyperglycemia: Secondary | ICD-10-CM | POA: Diagnosis not present

## 2017-05-04 DIAGNOSIS — E118 Type 2 diabetes mellitus with unspecified complications: Secondary | ICD-10-CM | POA: Diagnosis not present

## 2017-05-04 DIAGNOSIS — Z23 Encounter for immunization: Secondary | ICD-10-CM | POA: Diagnosis not present

## 2017-08-02 DIAGNOSIS — E118 Type 2 diabetes mellitus with unspecified complications: Secondary | ICD-10-CM | POA: Diagnosis not present

## 2017-08-07 DIAGNOSIS — D2271 Melanocytic nevi of right lower limb, including hip: Secondary | ICD-10-CM | POA: Diagnosis not present

## 2017-08-07 DIAGNOSIS — D1801 Hemangioma of skin and subcutaneous tissue: Secondary | ICD-10-CM | POA: Diagnosis not present

## 2017-08-07 DIAGNOSIS — D3613 Benign neoplasm of peripheral nerves and autonomic nervous system of lower limb, including hip: Secondary | ICD-10-CM | POA: Diagnosis not present

## 2017-08-07 DIAGNOSIS — L720 Epidermal cyst: Secondary | ICD-10-CM | POA: Diagnosis not present

## 2017-08-07 DIAGNOSIS — Z85828 Personal history of other malignant neoplasm of skin: Secondary | ICD-10-CM | POA: Diagnosis not present

## 2017-08-07 DIAGNOSIS — D225 Melanocytic nevi of trunk: Secondary | ICD-10-CM | POA: Diagnosis not present

## 2017-08-07 DIAGNOSIS — L570 Actinic keratosis: Secondary | ICD-10-CM | POA: Diagnosis not present

## 2017-08-07 DIAGNOSIS — D171 Benign lipomatous neoplasm of skin and subcutaneous tissue of trunk: Secondary | ICD-10-CM | POA: Diagnosis not present

## 2017-08-07 DIAGNOSIS — L821 Other seborrheic keratosis: Secondary | ICD-10-CM | POA: Diagnosis not present

## 2017-09-27 ENCOUNTER — Inpatient Hospital Stay: Payer: Medicare Other | Attending: Hematology & Oncology | Admitting: Hematology & Oncology

## 2017-09-27 ENCOUNTER — Other Ambulatory Visit: Payer: Self-pay | Admitting: Family

## 2017-09-27 ENCOUNTER — Encounter: Payer: Self-pay | Admitting: Hematology & Oncology

## 2017-09-27 ENCOUNTER — Inpatient Hospital Stay: Payer: Medicare Other

## 2017-09-27 ENCOUNTER — Other Ambulatory Visit: Payer: Self-pay

## 2017-09-27 VITALS — BP 150/87 | HR 60 | Resp 18

## 2017-09-27 VITALS — BP 155/84 | HR 60 | Temp 97.6°F | Resp 20 | Wt 217.4 lb

## 2017-09-27 DIAGNOSIS — Z79899 Other long term (current) drug therapy: Secondary | ICD-10-CM | POA: Insufficient documentation

## 2017-09-27 DIAGNOSIS — Z7984 Long term (current) use of oral hypoglycemic drugs: Secondary | ICD-10-CM | POA: Insufficient documentation

## 2017-09-27 DIAGNOSIS — D751 Secondary polycythemia: Secondary | ICD-10-CM

## 2017-09-27 DIAGNOSIS — E119 Type 2 diabetes mellitus without complications: Secondary | ICD-10-CM | POA: Insufficient documentation

## 2017-09-27 DIAGNOSIS — Z7982 Long term (current) use of aspirin: Secondary | ICD-10-CM | POA: Diagnosis not present

## 2017-09-27 LAB — CBC WITH DIFFERENTIAL (CANCER CENTER ONLY)
BASOS ABS: 0 10*3/uL (ref 0.0–0.1)
BASOS PCT: 1 %
EOS ABS: 0.2 10*3/uL (ref 0.0–0.5)
EOS PCT: 3 %
HCT: 47.8 % (ref 38.7–49.9)
Hemoglobin: 17.4 g/dL — ABNORMAL HIGH (ref 13.0–17.1)
Lymphocytes Relative: 24 %
Lymphs Abs: 1.5 10*3/uL (ref 0.9–3.3)
MCH: 32.2 pg (ref 28.0–33.4)
MCHC: 36.4 g/dL — AB (ref 32.0–35.9)
MCV: 88.4 fL (ref 82.0–98.0)
MONO ABS: 0.6 10*3/uL (ref 0.1–0.9)
MONOS PCT: 10 %
NEUTROS ABS: 3.8 10*3/uL (ref 1.5–6.5)
Neutrophils Relative %: 62 %
PLATELETS: 168 10*3/uL (ref 145–400)
RBC: 5.41 MIL/uL (ref 4.20–5.70)
RDW: 13.2 % (ref 11.1–15.7)
WBC Count: 6.2 10*3/uL (ref 4.0–10.0)

## 2017-09-27 LAB — IRON AND TIBC
Iron: 64 ug/dL (ref 42–163)
SATURATION RATIOS: 19 % — AB (ref 42–163)
TIBC: 331 ug/dL (ref 202–409)
UIBC: 267 ug/dL

## 2017-09-27 LAB — CMP (CANCER CENTER ONLY)
ALBUMIN: 4.2 g/dL (ref 3.5–5.0)
ALT: 33 U/L (ref 0–55)
ANION GAP: 8 (ref 3–11)
AST: 31 U/L (ref 5–34)
Alkaline Phosphatase: 74 U/L (ref 40–150)
BILIRUBIN TOTAL: 0.6 mg/dL (ref 0.2–1.2)
BUN: 14 mg/dL (ref 7–26)
CHLORIDE: 103 mmol/L (ref 98–109)
CO2: 26 mmol/L (ref 22–29)
Calcium: 10.3 mg/dL (ref 8.4–10.4)
Creatinine: 0.92 mg/dL (ref 0.70–1.30)
GFR, Est AFR Am: >60 mL/min (ref >60)
GFR, Estimated: >60 mL/min (ref >60)
GLUCOSE: 161 mg/dL — AB (ref 70–140)
POTASSIUM: 4.2 mmol/L (ref 3.5–5.1)
SODIUM: 137 mmol/L (ref 136–145)
TOTAL PROTEIN: 7.4 g/dL (ref 6.4–8.3)

## 2017-09-27 LAB — FERRITIN: Ferritin: 38 ng/mL (ref 22–316)

## 2017-09-27 NOTE — Progress Notes (Signed)
Hematology and Oncology Follow Up Visit  Gary Carpenter [id number] [birth year] 77 y.o. 09/27/2017   Principle Diagnosis:   Spurious polycythemia  Current Therapy:    Phlebotomy to maintain hematocrit less than 45%  Aspirin 81 mg by mouth daily     Interim History:  [name] is back for followup.  As always, he has been doing okay.  To no surprise, he is close on a business deal today.  He is incredibly gifted with respect to business.  He has extensive real Scientist, water quality.  He might be going to [place] this summer.  He does travel quite a bit.  Unfortunately, [place] did [place]  However, I am sure that if he goes, he will stay with 1 of his business partners at their vacation home.  He had no problems over the winter time.  He now is on metformin because of hyperglycemia.  When we last saw him in November 2018, his ferritin was 100 with an iron saturation of 35%.  He has had no fever.  He has had no bleeding.  There is been no change in bowel or bladder habits.  He has had no leg swelling.  Overall, his performance status is ECOG 0.  Medications:  Current Outpatient Medications:  .  aspirin 81 MG tablet, Take 81 mg by mouth daily.  , Disp: , Rfl:  .  fish oil-omega-3 fatty acids 1000 MG capsule, Take 1 g by mouth daily.  , Disp: , Rfl:  .  FREESTYLE LITE test strip, U TO CHECK ONCE A DAY UTD, Disp: , Rfl: 3 .  losartan-hydrochlorothiazide (HYZAAR) 100-25 MG per tablet, Take 1 tablet by mouth daily. , Disp: , Rfl:  .  metFORMIN (GLUCOPHAGE-XR) 750 MG 24 hr tablet, TK 1 T PO QD WITH THE EVE MEAL, Disp: , Rfl: 4 .  tadalafil (CIALIS) 10 MG tablet, Take 10 mg by mouth daily as needed.  , Disp: , Rfl:   Allergies:  Allergies  Allergen Reactions  . Lidocaine     "made me loopy"    Past Medical History, Surgical history, Social history, and Family History were reviewed and updated.  Review of Systems: Review of Systems  Constitutional:  Negative.   HENT: Negative.   Eyes: Negative.   Respiratory: Negative.   Cardiovascular: Negative.   Gastrointestinal: Negative.   Genitourinary: Negative.   Musculoskeletal: Negative.   Skin: Negative.   Neurological: Negative.   Endo/Heme/Allergies: Negative.   Psychiatric/Behavioral: Negative.      Physical Exam:  weight is 217 lb 6.4 oz (98.6 kg). His oral temperature is 97.6 F (36.4 C). His blood pressure is 155/84 (abnormal) and his pulse is 60. His respiration is 20 and oxygen saturation is 100%.   Physical Exam  Constitutional: He is oriented to person, place, and time.  HENT:  Head: Normocephalic and atraumatic.  Mouth/Throat: Oropharynx is clear and moist.  Eyes: Pupils are equal, round, and reactive to light. EOM are normal.  Neck: Normal range of motion.  Cardiovascular: Normal rate, regular rhythm and normal heart sounds.  Pulmonary/Chest: Effort normal and breath sounds normal.  Abdominal: Soft. Bowel sounds are normal.  Musculoskeletal: Normal range of motion. He exhibits no edema, tenderness or deformity.  Lymphadenopathy:    He has no cervical adenopathy.  Neurological: He is alert and oriented to person, place, and time.  Skin: Skin is warm and dry. No rash noted. No erythema.  Psychiatric: He has a normal  mood and affect. His behavior is normal. Judgment and thought content normal.  Vitals reviewed.    Lab Results  Component Value Date   WBC 6.2 09/27/2017   HGB 17.4 (H) 09/27/2017   HCT 47.8 09/27/2017   MCV 88.4 09/27/2017   PLT 168 09/27/2017     Chemistry      Component Value Date/Time   NA 143 03/29/2017 0856   NA 137 03/28/2016 0841   K 4.0 03/29/2017 0856   K 4.2 03/28/2016 0841   CL 101 03/29/2017 0856   CO2 30 03/29/2017 0856   CO2 25 03/28/2016 0841   BUN 14 03/29/2017 0856   BUN 16.4 03/28/2016 0841   CREATININE 0.8 03/29/2017 0856   CREATININE 1.0 03/28/2016 0841      Component Value Date/Time   CALCIUM 9.7 03/29/2017 0856    CALCIUM 9.7 03/28/2016 0841   ALKPHOS 86 (H) 03/29/2017 0856   ALKPHOS 78 03/28/2016 0841   AST 29 03/29/2017 0856   AST 23 03/28/2016 0841   ALT 41 03/29/2017 0856   ALT 34 03/28/2016 0841   BILITOT 1.00 03/29/2017 0856   BILITOT 1.14 03/28/2016 0841         Impression and Plan: Gary Carpenter is a 77 year old gentleman. He has spurious polycythemia. However, we are keeping his hematocrit below 45%. This does make him feel better.  I will go ahead and phlebotomize him today.   As always, we will see him back in 6 months.  He always knows that he can come back sooner than 6 months if he has any issues.    5/15/20199:23 AM

## 2017-09-27 NOTE — Patient Instructions (Signed)

## 2017-09-27 NOTE — Progress Notes (Signed)
Gary Carpenter presents today for phlebotomy per MD orders. Phlebotomy procedure started at [place] and ended at 51. 512 cc removed via 16 G needle at R antecubital site. Patient tolerated procedure well. Patient refused to wait 30 minutes post procedure, released stable and ASX.

## 2017-11-01 DIAGNOSIS — I10 Essential (primary) hypertension: Secondary | ICD-10-CM | POA: Diagnosis not present

## 2017-11-01 DIAGNOSIS — E118 Type 2 diabetes mellitus with unspecified complications: Secondary | ICD-10-CM | POA: Diagnosis not present

## 2017-11-01 DIAGNOSIS — Z125 Encounter for screening for malignant neoplasm of prostate: Secondary | ICD-10-CM | POA: Diagnosis not present

## 2017-11-01 DIAGNOSIS — E119 Type 2 diabetes mellitus without complications: Secondary | ICD-10-CM | POA: Diagnosis not present

## 2017-11-06 DIAGNOSIS — E118 Type 2 diabetes mellitus with unspecified complications: Secondary | ICD-10-CM | POA: Diagnosis not present

## 2017-11-06 DIAGNOSIS — D751 Secondary polycythemia: Secondary | ICD-10-CM | POA: Diagnosis not present

## 2017-11-06 DIAGNOSIS — N529 Male erectile dysfunction, unspecified: Secondary | ICD-10-CM | POA: Diagnosis not present

## 2017-11-06 DIAGNOSIS — K5730 Diverticulosis of large intestine without perforation or abscess without bleeding: Secondary | ICD-10-CM | POA: Diagnosis not present

## 2017-11-06 DIAGNOSIS — R011 Cardiac murmur, unspecified: Secondary | ICD-10-CM | POA: Diagnosis not present

## 2017-11-06 DIAGNOSIS — Z1212 Encounter for screening for malignant neoplasm of rectum: Secondary | ICD-10-CM | POA: Diagnosis not present

## 2017-11-06 DIAGNOSIS — Z0000 Encounter for general adult medical examination without abnormal findings: Secondary | ICD-10-CM | POA: Diagnosis not present

## 2017-11-06 DIAGNOSIS — J309 Allergic rhinitis, unspecified: Secondary | ICD-10-CM | POA: Diagnosis not present

## 2017-11-06 DIAGNOSIS — E119 Type 2 diabetes mellitus without complications: Secondary | ICD-10-CM | POA: Diagnosis not present

## 2017-11-06 DIAGNOSIS — I10 Essential (primary) hypertension: Secondary | ICD-10-CM | POA: Diagnosis not present

## 2017-11-06 DIAGNOSIS — L820 Inflamed seborrheic keratosis: Secondary | ICD-10-CM | POA: Diagnosis not present

## 2017-11-06 DIAGNOSIS — M539 Dorsopathy, unspecified: Secondary | ICD-10-CM | POA: Diagnosis not present

## 2017-11-06 DIAGNOSIS — Z6829 Body mass index (BMI) 29.0-29.9, adult: Secondary | ICD-10-CM | POA: Diagnosis not present

## 2017-12-07 DIAGNOSIS — R011 Cardiac murmur, unspecified: Secondary | ICD-10-CM | POA: Diagnosis not present

## 2018-02-21 DIAGNOSIS — D485 Neoplasm of uncertain behavior of skin: Secondary | ICD-10-CM | POA: Diagnosis not present

## 2018-02-21 DIAGNOSIS — Z85828 Personal history of other malignant neoplasm of skin: Secondary | ICD-10-CM | POA: Diagnosis not present

## 2018-02-21 DIAGNOSIS — L723 Sebaceous cyst: Secondary | ICD-10-CM | POA: Diagnosis not present

## 2018-02-21 DIAGNOSIS — L820 Inflamed seborrheic keratosis: Secondary | ICD-10-CM | POA: Diagnosis not present

## 2018-03-28 ENCOUNTER — Inpatient Hospital Stay: Payer: Medicare Other

## 2018-03-28 ENCOUNTER — Telehealth: Payer: Self-pay | Admitting: Hematology & Oncology

## 2018-03-28 ENCOUNTER — Encounter: Payer: Self-pay | Admitting: Hematology & Oncology

## 2018-03-28 ENCOUNTER — Other Ambulatory Visit: Payer: Self-pay

## 2018-03-28 ENCOUNTER — Inpatient Hospital Stay: Payer: Medicare Other | Attending: Hematology & Oncology | Admitting: Hematology & Oncology

## 2018-03-28 VITALS — BP 166/88 | HR 62 | Temp 97.6°F | Resp 20 | Wt 216.1 lb

## 2018-03-28 DIAGNOSIS — D751 Secondary polycythemia: Secondary | ICD-10-CM | POA: Insufficient documentation

## 2018-03-28 DIAGNOSIS — D508 Other iron deficiency anemias: Secondary | ICD-10-CM | POA: Insufficient documentation

## 2018-03-28 DIAGNOSIS — Z7982 Long term (current) use of aspirin: Secondary | ICD-10-CM | POA: Insufficient documentation

## 2018-03-28 DIAGNOSIS — Z79899 Other long term (current) drug therapy: Secondary | ICD-10-CM | POA: Insufficient documentation

## 2018-03-28 LAB — CBC WITH DIFFERENTIAL (CANCER CENTER ONLY)
Abs Immature Granulocytes: 0.03 10*3/uL (ref 0.00–0.07)
BASOS ABS: 0 10*3/uL (ref 0.0–0.1)
Basophils Relative: 1 %
Eosinophils Absolute: 0.2 10*3/uL (ref 0.0–0.5)
Eosinophils Relative: 3 %
HEMATOCRIT: 50.5 % (ref 39.0–52.0)
Hemoglobin: 17.5 g/dL — ABNORMAL HIGH (ref 13.0–17.0)
Immature Granulocytes: 0 %
LYMPHS ABS: 1.7 10*3/uL (ref 0.7–4.0)
Lymphocytes Relative: 25 %
MCH: 31.5 pg (ref 26.0–34.0)
MCHC: 34.7 g/dL (ref 30.0–36.0)
MCV: 91 fL (ref 80.0–100.0)
Monocytes Absolute: 0.7 10*3/uL (ref 0.1–1.0)
Monocytes Relative: 10 %
Neutro Abs: 4.1 10*3/uL (ref 1.7–7.7)
Neutrophils Relative %: 61 %
Platelet Count: 196 10*3/uL (ref 150–400)
RBC: 5.55 MIL/uL (ref 4.22–5.81)
RDW: 12.8 % (ref 11.5–15.5)
WBC Count: 6.7 10*3/uL (ref 4.0–10.5)
nRBC: 0 % (ref 0.0–0.2)

## 2018-03-28 LAB — CMP (CANCER CENTER ONLY)
ALT: 35 U/L (ref 0–44)
ANION GAP: 11 (ref 5–15)
AST: 33 U/L (ref 15–41)
Albumin: 4 g/dL (ref 3.5–5.0)
Alkaline Phosphatase: 76 U/L (ref 38–126)
BILIRUBIN TOTAL: 1.1 mg/dL (ref 0.3–1.2)
BUN: 14 mg/dL (ref 8–23)
CALCIUM: 10.2 mg/dL (ref 8.9–10.3)
CO2: 28 mmol/L (ref 22–32)
Chloride: 100 mmol/L (ref 98–111)
Creatinine: 1.03 mg/dL (ref 0.61–1.24)
Glucose, Bld: 166 mg/dL — ABNORMAL HIGH (ref 70–99)
POTASSIUM: 4.3 mmol/L (ref 3.5–5.1)
Sodium: 139 mmol/L (ref 135–145)
TOTAL PROTEIN: 7.5 g/dL (ref 6.5–8.1)

## 2018-03-28 LAB — IRON AND TIBC
Iron: 125 ug/dL (ref 42–163)
SATURATION RATIOS: 36 % (ref 20–55)
TIBC: 350 ug/dL (ref 202–409)
UIBC: 225 ug/dL (ref 117–376)

## 2018-03-28 LAB — FERRITIN: FERRITIN: 44 ng/mL (ref 24–336)

## 2018-03-28 NOTE — Telephone Encounter (Signed)
Appts scheduled calendar printed no avs per pt request per 11/13 los

## 2018-03-28 NOTE — Progress Notes (Signed)
Hematology and Oncology Follow Up Visit  Gary Carpenter [id number] 12-Jan-1941 77 y.o. 03/28/2018   Principle Diagnosis:   Spurious polycythemia  Current Therapy:    Phlebotomy to maintain hematocrit less than 45%  Aspirin 81 mg by mouth daily     Interim History:  Mr.  Gary Carpenter is back for followup.  He is doing okay.  We saw him 6 months ago.  He had a fairly quiet summer.  He really did not travel anywhere.  He does play golf 2 or 3 times a week.  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  He has had no problems with headaches.  He has had no issues with blurred vision.  He has had no problems with blood sugars.  He is on metformin.  He has had no change in bowel or bladder habits.  He denies any kind of itching.  Overall, his performance status is ECOG 0.  Medications:  Current Outpatient Medications:  .  aspirin 81 MG tablet, Take 81 mg by mouth daily.  , Disp: , Rfl:  .  fish oil-omega-3 fatty acids 1000 MG capsule, Take 1 g by mouth daily.  , Disp: , Rfl:  .  FREESTYLE LITE test strip, U TO CHECK ONCE A DAY UTD, Disp: , Rfl: 3 .  losartan-hydrochlorothiazide (HYZAAR) 100-25 MG per tablet, Take 1 tablet by mouth daily. , Disp: , Rfl:  .  metFORMIN (GLUCOPHAGE-XR) 750 MG 24 hr tablet, TK 1 T PO QD WITH THE EVE MEAL, Disp: , Rfl: 4 .  tadalafil (CIALIS) 10 MG tablet, Take 10 mg by mouth daily as needed.  , Disp: , Rfl:   Allergies:  Allergies  Allergen Reactions  . Lidocaine     "made me loopy"    Past Medical History, Surgical history, Social history, and Family History were reviewed and updated.  Review of Systems: Review of Systems  Constitutional: Negative.   HENT: Negative.   Eyes: Negative.   Respiratory: Negative.   Cardiovascular: Negative.   Gastrointestinal: Negative.   Genitourinary: Negative.   Musculoskeletal: Negative.   Skin: Negative.   Neurological:  Negative.   Endo/Heme/Allergies: Negative.   Psychiatric/Behavioral: Negative.      Physical Exam:  weight is 216 lb 1.9 oz (98 kg). His oral temperature is 97.6 F (36.4 C). His blood pressure is 166/88 (abnormal) and his pulse is 62. His respiration is 20 and oxygen saturation is 98%.   Physical Exam  Constitutional: He is oriented to person, place, and time.  HENT:  Head: Normocephalic and atraumatic.  Mouth/Throat: Oropharynx is clear and moist.  Eyes: Pupils are equal, round, and reactive to light. EOM are normal.  Neck: Normal range of motion.  Cardiovascular: Normal rate, regular rhythm and normal heart sounds.  Pulmonary/Chest: Effort normal and breath sounds normal.  Abdominal: Soft. Bowel sounds are normal.  Musculoskeletal: Normal range of motion. He exhibits no edema, tenderness or deformity.  Lymphadenopathy:    He has no cervical adenopathy.  Neurological: He is alert and oriented to person, place, and time.  Skin: Skin is warm and dry. No rash noted. No erythema.  Psychiatric: He has a normal mood and affect. His behavior is normal. Judgment and thought content normal.  Vitals reviewed.    Lab Results  Component Value Date   WBC 6.7 03/28/2018   HGB 17.5 (H) 03/28/2018   HCT 50.5  03/28/2018   MCV 91.0 03/28/2018   PLT 196 03/28/2018     Chemistry      Component Value Date/Time   NA 137 09/27/2017 0844   NA 143 03/29/2017 0856   NA 137 03/28/2016 0841   K 4.2 09/27/2017 0844   K 4.0 03/29/2017 0856   K 4.2 03/28/2016 0841   CL 103 09/27/2017 0844   CL 101 03/29/2017 0856   CO2 26 09/27/2017 0844   CO2 30 03/29/2017 0856   CO2 25 03/28/2016 0841   BUN 14 09/27/2017 0844   BUN 14 03/29/2017 0856   BUN 16.4 03/28/2016 0841   CREATININE 0.92 09/27/2017 0844   CREATININE 0.8 03/29/2017 0856   CREATININE 1.0 03/28/2016 0841      Component Value Date/Time   CALCIUM 10.3 09/27/2017 0844   CALCIUM 9.7 03/29/2017 0856   CALCIUM 9.7 03/28/2016 0841    ALKPHOS 74 09/27/2017 0844   ALKPHOS 86 (H) 03/29/2017 0856   ALKPHOS 78 03/28/2016 0841   AST 31 09/27/2017 0844   AST 23 03/28/2016 0841   ALT 33 09/27/2017 0844   ALT 41 03/29/2017 0856   ALT 34 03/28/2016 0841   BILITOT 0.6 09/27/2017 0844   BILITOT 1.14 03/28/2016 0841      Impression and Plan: Gary Carpenter is a 77 year old gentleman. He has spurious polycythemia. However, we are keeping his hematocrit below 45%. This does make him feel better.  I will go ahead and phlebotomize him today.   As always, we will see him back in 6 months.  He always knows that he can come back sooner than 6 months if he has any issues.    11/13/20199:33 AM

## 2018-03-28 NOTE — Progress Notes (Signed)
Gary Carpenter presents today for phlebotomy per MD orders. Phlebotomy procedure started at 1000 and ended at 1010 via phlebotomy kit to right ac.  525 grams removed. Patient refused to stay for 30 minutes after procedure and is without complaints at time of discharge.  Patient tolerated procedure well. IV needle removed intact.  Pressure dressing clean, dry and intact to right ac at time of discharge.

## 2018-09-03 DIAGNOSIS — D171 Benign lipomatous neoplasm of skin and subcutaneous tissue of trunk: Secondary | ICD-10-CM | POA: Diagnosis not present

## 2018-09-03 DIAGNOSIS — Z85828 Personal history of other malignant neoplasm of skin: Secondary | ICD-10-CM | POA: Diagnosis not present

## 2018-09-03 DIAGNOSIS — L918 Other hypertrophic disorders of the skin: Secondary | ICD-10-CM | POA: Diagnosis not present

## 2018-09-03 DIAGNOSIS — L570 Actinic keratosis: Secondary | ICD-10-CM | POA: Diagnosis not present

## 2018-09-03 DIAGNOSIS — L821 Other seborrheic keratosis: Secondary | ICD-10-CM | POA: Diagnosis not present

## 2018-09-03 DIAGNOSIS — L820 Inflamed seborrheic keratosis: Secondary | ICD-10-CM | POA: Diagnosis not present

## 2018-09-03 DIAGNOSIS — D1801 Hemangioma of skin and subcutaneous tissue: Secondary | ICD-10-CM | POA: Diagnosis not present

## 2018-09-26 ENCOUNTER — Inpatient Hospital Stay: Payer: Medicare Other

## 2018-09-26 ENCOUNTER — Inpatient Hospital Stay (HOSPITAL_BASED_OUTPATIENT_CLINIC_OR_DEPARTMENT_OTHER): Payer: Medicare Other | Admitting: Hematology & Oncology

## 2018-09-26 ENCOUNTER — Encounter: Payer: Self-pay | Admitting: Hematology & Oncology

## 2018-09-26 ENCOUNTER — Inpatient Hospital Stay: Payer: Medicare Other | Attending: Hematology & Oncology

## 2018-09-26 ENCOUNTER — Other Ambulatory Visit: Payer: Self-pay

## 2018-09-26 VITALS — BP 160/92 | HR 65 | Temp 98.1°F | Resp 20 | Wt 219.0 lb

## 2018-09-26 VITALS — BP 150/65

## 2018-09-26 DIAGNOSIS — Z79899 Other long term (current) drug therapy: Secondary | ICD-10-CM | POA: Insufficient documentation

## 2018-09-26 DIAGNOSIS — D751 Secondary polycythemia: Secondary | ICD-10-CM | POA: Diagnosis not present

## 2018-09-26 DIAGNOSIS — Z7984 Long term (current) use of oral hypoglycemic drugs: Secondary | ICD-10-CM | POA: Insufficient documentation

## 2018-09-26 DIAGNOSIS — D509 Iron deficiency anemia, unspecified: Secondary | ICD-10-CM | POA: Insufficient documentation

## 2018-09-26 DIAGNOSIS — Z7982 Long term (current) use of aspirin: Secondary | ICD-10-CM

## 2018-09-26 DIAGNOSIS — D508 Other iron deficiency anemias: Secondary | ICD-10-CM

## 2018-09-26 LAB — CBC WITH DIFFERENTIAL (CANCER CENTER ONLY)
Abs Immature Granulocytes: 0.03 10*3/uL (ref 0.00–0.07)
Basophils Absolute: 0 10*3/uL (ref 0.0–0.1)
Basophils Relative: 1 %
Eosinophils Absolute: 0.1 10*3/uL (ref 0.0–0.5)
Eosinophils Relative: 2 %
HCT: 47.9 % (ref 39.0–52.0)
Hemoglobin: 17 g/dL (ref 13.0–17.0)
Immature Granulocytes: 1 %
Lymphocytes Relative: 29 %
Lymphs Abs: 1.7 10*3/uL (ref 0.7–4.0)
MCH: 32.1 pg (ref 26.0–34.0)
MCHC: 35.5 g/dL (ref 30.0–36.0)
MCV: 90.4 fL (ref 80.0–100.0)
Monocytes Absolute: 0.6 10*3/uL (ref 0.1–1.0)
Monocytes Relative: 11 %
Neutro Abs: 3.3 10*3/uL (ref 1.7–7.7)
Neutrophils Relative %: 56 %
Platelet Count: 178 10*3/uL (ref 150–400)
RBC: 5.3 MIL/uL (ref 4.22–5.81)
RDW: 12.2 % (ref 11.5–15.5)
WBC Count: 5.8 10*3/uL (ref 4.0–10.5)
nRBC: 0 % (ref 0.0–0.2)

## 2018-09-26 LAB — CMP (CANCER CENTER ONLY)
ALT: 37 U/L (ref 0–44)
AST: 33 U/L (ref 15–41)
Albumin: 4.2 g/dL (ref 3.5–5.0)
Alkaline Phosphatase: 72 U/L (ref 38–126)
Anion gap: 8 (ref 5–15)
BUN: 14 mg/dL (ref 8–23)
CO2: 31 mmol/L (ref 22–32)
Calcium: 10 mg/dL (ref 8.9–10.3)
Chloride: 98 mmol/L (ref 98–111)
Creatinine: 0.98 mg/dL (ref 0.61–1.24)
GFR, Est AFR Am: >60 mL/min (ref >60)
GFR, Estimated: >60 mL/min (ref >60)
Glucose, Bld: 227 mg/dL — ABNORMAL HIGH (ref 70–99)
Potassium: 4.2 mmol/L (ref 3.5–5.1)
Sodium: 137 mmol/L (ref 135–145)
Total Bilirubin: 0.9 mg/dL (ref 0.3–1.2)
Total Protein: 6.9 g/dL (ref 6.5–8.1)

## 2018-09-26 LAB — IRON AND TIBC
Iron: 128 ug/dL (ref 42–163)
Saturation Ratios: 42 % (ref 20–55)
TIBC: 308 ug/dL (ref 202–409)
UIBC: 180 ug/dL (ref 117–376)

## 2018-09-26 LAB — FERRITIN: Ferritin: 76 ng/mL (ref 24–336)

## 2018-09-26 NOTE — Patient Instructions (Signed)
Therapeutic Phlebotomy Therapeutic phlebotomy is the planned removal of blood from a person's body for the purpose of treating a medical condition. The procedure is similar to donating blood. Usually, about a pint (470 mL, or 0.47 L) of blood is removed. The average adult has 9-12 pints (4.3-5.7 L) of blood in the body. Therapeutic phlebotomy may be used to treat the following medical conditions:  Hemochromatosis. This is a condition in which the blood contains too much iron.  Polycythemia vera. This is a condition in which the blood contains too many red blood cells.  Porphyria cutanea tarda. This is a disease in which an important part of hemoglobin is not made properly. It results in the buildup of abnormal amounts of porphyrins in the body.  Sickle cell disease. This is a condition in which the red blood cells form an abnormal crescent shape rather than a round shape. Tell a health care provider about:  Any allergies you have.  All medicines you are taking, including vitamins, herbs, eye drops, creams, and over-the-counter medicines.  Any problems you or family members have had with anesthetic medicines.  Any blood disorders you have.  Any surgeries you have had.  Any medical conditions you have.  Whether you are pregnant or may be pregnant. What are the risks? Generally, this is a safe procedure. However, problems may occur, including:  Nausea or light-headedness.  Low blood pressure (hypotension).  Soreness, bleeding, swelling, or bruising at the needle insertion site.  Infection. What happens before the procedure?  Follow instructions from your health care provider about eating or drinking restrictions.  Ask your health care provider about: ? Changing or stopping your regular medicines. This is especially important if you are taking diabetes medicines or blood thinners (anticoagulants). ? Taking medicines such as aspirin and ibuprofen. These medicines can thin your  blood. Do not take these medicines unless your health care provider tells you to take them. ? Taking over-the-counter medicines, vitamins, herbs, and supplements.  Wear clothing with sleeves that can be raised above the elbow.  Plan to have someone take you home from the hospital or clinic.  You may have a blood sample taken.  Your blood pressure, pulse rate, and breathing rate will be measured. What happens during the procedure?   To lower your risk of infection: ? Your health care team will wash or sanitize their hands. ? Your skin will be cleaned with an antiseptic.  You may be given a medicine to numb the area (local anesthetic).  A tourniquet will be placed on your arm.  A needle will be inserted into one of your veins.  Tubing and a collection bag will be attached to that needle.  Blood will flow through the needle and tubing into the collection bag.  The collection bag will be placed lower than your arm to allow gravity to help the flow of blood into the bag.  You may be asked to open and close your hand slowly and continually during the entire collection.  After the specified amount of blood has been removed from your body, the collection bag and tubing will be clamped.  The needle will be removed from your vein.  Pressure will be held on the site of the needle insertion to stop the bleeding.  A bandage (dressing) will be placed over the needle insertion site. The procedure may vary among health care providers and hospitals. What happens after the procedure?  Your blood pressure, pulse rate, and breathing rate will be   measured after the procedure.  You will be encouraged to drink fluids.  Your recovery will be assessed and monitored.  You can return to your normal activities as told by your health care provider. Summary  Therapeutic phlebotomy is the planned removal of blood from a person's body for the purpose of treating a medical condition.  Therapeutic  phlebotomy may be used to treat hemochromatosis, polycythemia vera, porphyria cutanea tarda, or sickle cell disease.  In the procedure, a needle is inserted and about a pint (470 mL, or 0.47 L) of blood is removed. The average adult has 9-12 pints (4.3-5.7 L) of blood in the body.  This is generally a safe procedure, but it can sometimes cause problems such as nausea, light-headedness, or low blood pressure (hypotension). This information is not intended to replace advice given to you by your health care provider. Make sure you discuss any questions you have with your health care provider. Document Released: 10/04/2010 Document Revised: 05/18/2017 Document Reviewed: 05/18/2017 Elsevier Interactive Patient Education  2019 [employer]  

## 2018-09-26 NOTE — Progress Notes (Signed)
1 unit phlebotomy performed over 20 minutes using a 16 gauge phlebotomy kit in the right AC. Patient tolerated well. Nourishment provided. Patient didn't want to stay for the 30 minute post phlebotomy observation. Patient VSS. Patient discharged ambulatory without complaints or concerns.

## 2018-09-26 NOTE — Progress Notes (Signed)
Hematology and Oncology Follow Up Visit  Gary Carpenter [id number] 1941/02/19 78 y.o. 09/26/2018   Principle Diagnosis:   Spurious polycythemia  Current Therapy:    Phlebotomy to maintain hematocrit less than 45%  Aspirin 81 mg by mouth daily     Interim History:  Mr.  Gary Carpenter is back for followup.  Is doing quite well.  Despite the coronavirus, he is playing a a lot of golf.  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  This is why he is so successful.  He has gained a little bit of weight.  He does a lot of his own cooking.  He has had no nausea or vomiting.  He has had no headaches.  He has had no rashes.  There is been no leg swelling.  Overall, his performance status is ECOG 0.  Medications:  Current Outpatient Medications:  .  aspirin 81 MG tablet, Take 81 mg by mouth daily.  , Disp: , Rfl:  .  fish oil-omega-3 fatty acids 1000 MG capsule, Take 1 g by mouth daily.  , Disp: , Rfl:  .  FREESTYLE LITE test strip, U TO CHECK ONCE A DAY UTD, Disp: , Rfl: 3 .  losartan-hydrochlorothiazide (HYZAAR) 100-25 MG per tablet, Take 1 tablet by mouth daily. , Disp: , Rfl:  .  metFORMIN (GLUCOPHAGE-XR) 750 MG 24 hr tablet, TK 1 T PO QD WITH THE EVE MEAL, Disp: , Rfl: 4 .  tadalafil (CIALIS) 10 MG tablet, Take 10 mg by mouth daily as needed.  , Disp: , Rfl:   Allergies:  Allergies  Allergen Reactions  . Lidocaine     "made me loopy"    Past Medical History, Surgical history, Social history, and Family History were reviewed and updated.  Review of Systems: Review of Systems  Constitutional: Negative.   HENT: Negative.   Eyes: Negative.   Respiratory: Negative.   Cardiovascular: Negative.   Gastrointestinal: Negative.   Genitourinary: Negative.   Musculoskeletal: Negative.   Skin: Negative.   Neurological: Negative.   Endo/Heme/Allergies: Negative.   Psychiatric/Behavioral: Negative.      Physical Exam:  weight is 219  lb (99.3 kg). His oral temperature is 98.1 F (36.7 C). His blood pressure is 160/92 (abnormal) and his pulse is 65. His respiration is 20 and oxygen saturation is 99%.   Physical Exam Vitals signs reviewed.  HENT:     Head: Normocephalic and atraumatic.  Eyes:     Pupils: Pupils are equal, round, and reactive to light.  Neck:     Musculoskeletal: Normal range of motion.  Cardiovascular:     Rate and Rhythm: Normal rate and regular rhythm.     Heart sounds: Normal heart sounds.  Pulmonary:     Effort: Pulmonary effort is normal.     Breath sounds: Normal breath sounds.  Abdominal:     General: Bowel sounds are normal.     Palpations: Abdomen is soft.  Musculoskeletal: Normal range of motion.        General: No tenderness or deformity.  Lymphadenopathy:     Cervical: No cervical adenopathy.  Skin:    General: Skin is warm and dry.     Findings: No erythema or rash.  Neurological:     Mental Status: He is alert and oriented to person, place, and time.  Psychiatric:        Behavior: Behavior normal.  Thought Content: Thought content normal.        Judgment: Judgment normal.      Lab Results  Component Value Date   WBC 5.8 09/26/2018   HGB 17.0 09/26/2018   HCT 47.9 09/26/2018   MCV 90.4 09/26/2018   PLT 178 09/26/2018     Chemistry      Component Value Date/Time   NA 139 03/28/2018 0829   NA 143 03/29/2017 0856   NA 137 03/28/2016 0841   K 4.3 03/28/2018 0829   K 4.0 03/29/2017 0856   K 4.2 03/28/2016 0841   CL 100 03/28/2018 0829   CL 101 03/29/2017 0856   CO2 28 03/28/2018 0829   CO2 30 03/29/2017 0856   CO2 25 03/28/2016 0841   BUN 14 03/28/2018 0829   BUN 14 03/29/2017 0856   BUN 16.4 03/28/2016 0841   CREATININE 1.03 03/28/2018 0829   CREATININE 0.8 03/29/2017 0856   CREATININE 1.0 03/28/2016 0841      Component Value Date/Time   CALCIUM 10.2 03/28/2018 0829   CALCIUM 9.7 03/29/2017 0856   CALCIUM 9.7 03/28/2016 0841   ALKPHOS 76  03/28/2018 0829   ALKPHOS 86 (H) 03/29/2017 0856   ALKPHOS 78 03/28/2016 0841   AST 33 03/28/2018 0829   AST 23 03/28/2016 0841   ALT 35 03/28/2018 0829   ALT 41 03/29/2017 0856   ALT 34 03/28/2016 0841   BILITOT 1.1 03/28/2018 0829   BILITOT 1.14 03/28/2016 0841      Impression and Plan: Mr. [name] is a 78 year old gentleman. He has spurious polycythemia. However, we are keeping his hematocrit below 45%. This does make him feel better.  I will go ahead and phlebotomize him today.   As always, we will see him back in 6 months.  He always knows that he can come back sooner than 6 months if he has any issues.    5/13/20209:07 AM

## 2018-11-07 DIAGNOSIS — E119 Type 2 diabetes mellitus without complications: Secondary | ICD-10-CM | POA: Diagnosis not present

## 2018-11-07 DIAGNOSIS — Z125 Encounter for screening for malignant neoplasm of prostate: Secondary | ICD-10-CM | POA: Diagnosis not present

## 2018-11-07 DIAGNOSIS — I10 Essential (primary) hypertension: Secondary | ICD-10-CM | POA: Diagnosis not present

## 2018-11-07 DIAGNOSIS — Z1159 Encounter for screening for other viral diseases: Secondary | ICD-10-CM | POA: Diagnosis not present

## 2018-11-14 DIAGNOSIS — N529 Male erectile dysfunction, unspecified: Secondary | ICD-10-CM | POA: Diagnosis not present

## 2018-11-14 DIAGNOSIS — E118 Type 2 diabetes mellitus with unspecified complications: Secondary | ICD-10-CM | POA: Diagnosis not present

## 2018-11-14 DIAGNOSIS — I8390 Asymptomatic varicose veins of unspecified lower extremity: Secondary | ICD-10-CM | POA: Diagnosis not present

## 2018-11-14 DIAGNOSIS — I10 Essential (primary) hypertension: Secondary | ICD-10-CM | POA: Diagnosis not present

## 2018-11-14 DIAGNOSIS — I499 Cardiac arrhythmia, unspecified: Secondary | ICD-10-CM | POA: Diagnosis not present

## 2018-11-14 DIAGNOSIS — R0989 Other specified symptoms and signs involving the circulatory and respiratory systems: Secondary | ICD-10-CM | POA: Diagnosis not present

## 2018-11-14 DIAGNOSIS — Z87891 Personal history of nicotine dependence: Secondary | ICD-10-CM | POA: Diagnosis not present

## 2018-11-14 DIAGNOSIS — Z8719 Personal history of other diseases of the digestive system: Secondary | ICD-10-CM | POA: Diagnosis not present

## 2019-01-17 DIAGNOSIS — Z23 Encounter for immunization: Secondary | ICD-10-CM | POA: Diagnosis not present

## 2019-02-18 DIAGNOSIS — E118 Type 2 diabetes mellitus with unspecified complications: Secondary | ICD-10-CM | POA: Diagnosis not present

## 2019-02-20 DIAGNOSIS — Z6829 Body mass index (BMI) 29.0-29.9, adult: Secondary | ICD-10-CM | POA: Diagnosis not present

## 2019-02-20 DIAGNOSIS — E119 Type 2 diabetes mellitus without complications: Secondary | ICD-10-CM | POA: Diagnosis not present

## 2019-02-20 DIAGNOSIS — E781 Pure hyperglyceridemia: Secondary | ICD-10-CM | POA: Diagnosis not present

## 2019-02-20 DIAGNOSIS — I10 Essential (primary) hypertension: Secondary | ICD-10-CM | POA: Diagnosis not present

## 2019-03-27 ENCOUNTER — Inpatient Hospital Stay: Payer: Medicare Other

## 2019-03-27 ENCOUNTER — Inpatient Hospital Stay: Payer: Medicare Other | Attending: Hematology & Oncology | Admitting: Hematology & Oncology

## 2019-03-27 ENCOUNTER — Other Ambulatory Visit: Payer: Self-pay

## 2019-03-27 ENCOUNTER — Encounter: Payer: Self-pay | Admitting: Hematology & Oncology

## 2019-03-27 VITALS — BP 165/87 | HR 64 | Temp 97.1°F | Resp 20 | Wt 219.0 lb

## 2019-03-27 DIAGNOSIS — D508 Other iron deficiency anemias: Secondary | ICD-10-CM | POA: Insufficient documentation

## 2019-03-27 DIAGNOSIS — D751 Secondary polycythemia: Secondary | ICD-10-CM

## 2019-03-27 LAB — CBC WITH DIFFERENTIAL (CANCER CENTER ONLY)
Abs Immature Granulocytes: 0.03 10*3/uL (ref 0.00–0.07)
Basophils Absolute: 0 10*3/uL (ref 0.0–0.1)
Basophils Relative: 1 %
Eosinophils Absolute: 0.1 10*3/uL (ref 0.0–0.5)
Eosinophils Relative: 2 %
HCT: 46.3 % (ref 39.0–52.0)
Hemoglobin: 16.3 g/dL (ref 13.0–17.0)
Immature Granulocytes: 1 %
Lymphocytes Relative: 29 %
Lymphs Abs: 1.7 10*3/uL (ref 0.7–4.0)
MCH: 31.6 pg (ref 26.0–34.0)
MCHC: 35.2 g/dL (ref 30.0–36.0)
MCV: 89.7 fL (ref 80.0–100.0)
Monocytes Absolute: 0.7 10*3/uL (ref 0.1–1.0)
Monocytes Relative: 12 %
Neutro Abs: 3.2 10*3/uL (ref 1.7–7.7)
Neutrophils Relative %: 55 %
Platelet Count: 170 10*3/uL (ref 150–400)
RBC: 5.16 MIL/uL (ref 4.22–5.81)
RDW: 12.5 % (ref 11.5–15.5)
WBC Count: 5.9 10*3/uL (ref 4.0–10.5)
nRBC: 0 % (ref 0.0–0.2)

## 2019-03-27 LAB — CMP (CANCER CENTER ONLY)
ALT: 25 U/L (ref 0–44)
AST: 21 U/L (ref 15–41)
Albumin: 4.4 g/dL (ref 3.5–5.0)
Alkaline Phosphatase: 60 U/L (ref 38–126)
Anion gap: 7 (ref 5–15)
BUN: 18 mg/dL (ref 8–23)
CO2: 28 mmol/L (ref 22–32)
Calcium: 9.6 mg/dL (ref 8.9–10.3)
Chloride: 101 mmol/L (ref 98–111)
Creatinine: 0.97 mg/dL (ref 0.61–1.24)
GFR, Est AFR Am: >60 mL/min (ref >60)
GFR, Estimated: >60 mL/min (ref >60)
Glucose, Bld: 192 mg/dL — ABNORMAL HIGH (ref 70–99)
Potassium: 4.6 mmol/L (ref 3.5–5.1)
Sodium: 136 mmol/L (ref 135–145)
Total Bilirubin: 0.7 mg/dL (ref 0.3–1.2)
Total Protein: 6.7 g/dL (ref 6.5–8.1)

## 2019-03-27 LAB — IRON AND TIBC
Iron: 98 ug/dL (ref 42–163)
Saturation Ratios: 32 % (ref 20–55)
TIBC: 304 ug/dL (ref 202–409)
UIBC: 205 ug/dL (ref 117–376)

## 2019-03-27 LAB — FERRITIN: Ferritin: 63 ng/mL (ref 24–336)

## 2019-03-27 NOTE — Progress Notes (Signed)
Gary Carpenter presents today for phlebotomy per MD orders. Phlebotomy procedure started at 0935 and ended at 0939. 516 cc removed via 16 G needle at R lateral Ac site. Patient tolerated procedure well. Refused to wait 30 minutes post phlebotomy, released stable and ASX.

## 2019-03-27 NOTE — Progress Notes (Signed)
Hematology and Oncology Follow Up Visit  Gary Carpenter [id number] 01/02/1941 78 y.o. 03/27/2019   Principle Diagnosis:   Spurious polycythemia  Current Therapy:    Phlebotomy to maintain hematocrit less than 45%  Aspirin 81 mg by mouth daily     Interim History:  [name] is back for followup.  He is doing quite well.  Despite the coronavirus, he is playing a a lot of golf.  He has been quite cautious however.  He is planned to have his family come in for Thanksgiving.  They will be outside and try to socially distance.  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  This is why he is so successful.  He has gained a little bit of weight.  He does a lot of his own cooking.   He i with his lady friend.  She apparently is a good cook.  He has had no nausea or vomiting.  He has had no headaches.  He has had no rashes.  There is been no leg swelling.  Overall, his performance status is ECOG 0.  Medications:  Current Outpatient Medications:  .  aspirin 81 MG tablet, Take 81 mg by mouth daily.  , Disp: , Rfl:  .  fish oil-omega-3 fatty acids 1000 MG capsule, Take 1 g by mouth daily.  , Disp: , Rfl:  .  FREESTYLE LITE test strip, U TO CHECK ONCE A DAY UTD, Disp: , Rfl: 3 .  losartan-hydrochlorothiazide (HYZAAR) 100-25 MG per tablet, Take 1 tablet by mouth daily. , Disp: , Rfl:  .  metFORMIN (GLUCOPHAGE-XR) 750 MG 24 hr tablet, TK 1 T PO QD WITH THE EVE MEAL, Disp: , Rfl: 4 .  tadalafil (CIALIS) 10 MG tablet, Take 10 mg by mouth daily as needed.  , Disp: , Rfl:   Allergies:  Allergies  Allergen Reactions  . Lidocaine     "made me loopy"    Past Medical History, Surgical history, Social history, and Family History were reviewed and updated.  Review of Systems: Review of Systems  Constitutional: Negative.   HENT: Negative.   Eyes: Negative.   Respiratory: Negative.   Cardiovascular: Negative.   Gastrointestinal: Negative.    Genitourinary: Negative.   Musculoskeletal: Negative.   Skin: Negative.   Neurological: Negative.   Endo/Heme/Allergies: Negative.   Psychiatric/Behavioral: Negative.      Physical Exam:  weight is 219 lb (99.3 kg). His oral temperature is 97.1 F (36.2 C) (abnormal). His blood pressure is 165/87 (abnormal) and his pulse is 64. His respiration is 20 and oxygen saturation is 97%.   Physical Exam Vitals signs reviewed.  HENT:     Head: Normocephalic and atraumatic.  Eyes:     Pupils: Pupils are equal, round, and reactive to light.  Neck:     Musculoskeletal: Normal range of motion.  Cardiovascular:     Rate and Rhythm: Normal rate and regular rhythm.     Heart sounds: Normal heart sounds.  Pulmonary:     Effort: Pulmonary effort is normal.     Breath sounds: Normal breath sounds.  Abdominal:     General: Bowel sounds are normal.     Palpations: Abdomen is soft.  Musculoskeletal: Normal range of motion.        General: No tenderness or deformity.  Lymphadenopathy:     Cervical: No cervical adenopathy.  Skin:    General: Skin is warm and  dry.     Findings: No erythema or rash.  Neurological:     Mental Status: He is alert and oriented to person, place, and time.  Psychiatric:        Behavior: Behavior normal.        Thought Content: Thought content normal.        Judgment: Judgment normal.      Lab Results  Component Value Date   WBC 5.9 03/27/2019   HGB 16.3 03/27/2019   HCT 46.3 03/27/2019   MCV 89.7 03/27/2019   PLT 170 03/27/2019     Chemistry      Component Value Date/Time   NA 136 03/27/2019 0842   NA 143 03/29/2017 0856   NA 137 03/28/2016 0841   K 4.6 03/27/2019 0842   K 4.0 03/29/2017 0856   K 4.2 03/28/2016 0841   CL 101 03/27/2019 0842   CL 101 03/29/2017 0856   CO2 28 03/27/2019 0842   CO2 30 03/29/2017 0856   CO2 25 03/28/2016 0841   BUN 18 03/27/2019 0842   BUN 14 03/29/2017 0856   BUN 16.4 03/28/2016 0841   CREATININE 0.97  03/27/2019 0842   CREATININE 0.8 03/29/2017 0856   CREATININE 1.0 03/28/2016 0841      Component Value Date/Time   CALCIUM 9.6 03/27/2019 0842   CALCIUM 9.7 03/29/2017 0856   CALCIUM 9.7 03/28/2016 0841   ALKPHOS 60 03/27/2019 0842   ALKPHOS 86 (H) 03/29/2017 0856   ALKPHOS 78 03/28/2016 0841   AST 21 03/27/2019 0842   AST 23 03/28/2016 0841   ALT 25 03/27/2019 0842   ALT 41 03/29/2017 0856   ALT 34 03/28/2016 0841   BILITOT 0.7 03/27/2019 0842   BILITOT 1.14 03/28/2016 0841      Impression and Plan: Gary Carpenter is a 78 year old gentleman. He has spurious polycythemia. However, we are keeping his hematocrit below 45%. This does make him feel better.  I will go ahead and phlebotomize him today.  The trend with his hemoglobin has been downward over the past year.  Hopefully, with his next phlebotomy, we will get his iron down even more so that he will not need to be phlebotomized when we see him back.  As always, we will see him back in 6 months.  He always knows that he can come back sooner than 6 months if he has any issues.    11/11/20209:33 AM

## 2019-03-27 NOTE — Patient Instructions (Signed)

## 2019-04-17 DIAGNOSIS — H524 Presbyopia: Secondary | ICD-10-CM | POA: Diagnosis not present

## 2019-04-17 DIAGNOSIS — E119 Type 2 diabetes mellitus without complications: Secondary | ICD-10-CM | POA: Diagnosis not present

## 2019-04-17 DIAGNOSIS — H52223 Regular astigmatism, bilateral: Secondary | ICD-10-CM | POA: Diagnosis not present

## 2019-04-17 DIAGNOSIS — Z961 Presence of intraocular lens: Secondary | ICD-10-CM | POA: Diagnosis not present

## 2019-04-17 DIAGNOSIS — H35371 Puckering of macula, right eye: Secondary | ICD-10-CM | POA: Diagnosis not present

## 2019-04-17 DIAGNOSIS — H5203 Hypermetropia, bilateral: Secondary | ICD-10-CM | POA: Diagnosis not present

## 2019-06-05 ENCOUNTER — Other Ambulatory Visit: Payer: Medicare Other

## 2019-06-05 ENCOUNTER — Ambulatory Visit: Payer: Medicare Other | Attending: Internal Medicine

## 2019-06-05 DIAGNOSIS — Z23 Encounter for immunization: Secondary | ICD-10-CM | POA: Diagnosis not present

## 2019-06-05 NOTE — Progress Notes (Signed)
   Covid-19 Vaccination Clinic  Name:  Gary Carpenter    MRN: [id number] DOB: Feb 01, 1941  06/05/2019  [name] was observed post Covid-19 immunization for 30 minutes based on pre-vaccination screening without incidence. He was provided with Vaccine Information Sheet and instruction to access the V-Safe system.   [name] was instructed to call 911 with any severe reactions post vaccine: Marland Kitchen Difficulty breathing  . Swelling of your face and throat  . A fast heartbeat  . A bad rash all over your body  . Dizziness and weakness    Immunizations Administered    Name Date Dose VIS Date Route   Pfizer COVID-19 Vaccine 06/05/2019  5:56 PM 0.3 mL 04/26/2019 Intramuscular   Manufacturer: [place]   Lot: [id number]   [place]: [id number]

## 2019-06-06 ENCOUNTER — Ambulatory Visit: Payer: Medicare Other

## 2019-06-28 ENCOUNTER — Ambulatory Visit: Payer: Medicare Other | Attending: Internal Medicine

## 2019-06-28 DIAGNOSIS — Z23 Encounter for immunization: Secondary | ICD-10-CM | POA: Insufficient documentation

## 2019-06-28 NOTE — Progress Notes (Signed)
   Covid-19 Vaccination Clinic  Name:  Ashyr Cuen    MRN: [id number] DOB: [birth year]  06/28/2019  Mr. Loewy was observed post Covid-19 immunization for 15 minutes without incidence. He was provided with Vaccine Information Sheet and instruction to access the V-Safe system.   Mr. Khanna was instructed to call 911 with any severe reactions post vaccine: Marland Kitchen Difficulty breathing  . Swelling of your face and throat  . A fast heartbeat  . A bad rash all over your body  . Dizziness and weakness    Immunizations Administered    Name Date Dose VIS Date Route   Pfizer COVID-19 Vaccine 06/28/2019  8:29 AM 0.3 mL 04/26/2019 Intramuscular   Manufacturer: [place]   Lot: [id number]   [place]: [id number]

## 2019-09-04 DIAGNOSIS — L720 Epidermal cyst: Secondary | ICD-10-CM | POA: Diagnosis not present

## 2019-09-04 DIAGNOSIS — Z85828 Personal history of other malignant neoplasm of skin: Secondary | ICD-10-CM | POA: Diagnosis not present

## 2019-09-04 DIAGNOSIS — D485 Neoplasm of uncertain behavior of skin: Secondary | ICD-10-CM | POA: Diagnosis not present

## 2019-09-04 DIAGNOSIS — L570 Actinic keratosis: Secondary | ICD-10-CM | POA: Diagnosis not present

## 2019-09-04 DIAGNOSIS — L821 Other seborrheic keratosis: Secondary | ICD-10-CM | POA: Diagnosis not present

## 2019-09-24 ENCOUNTER — Ambulatory Visit: Payer: Medicare Other | Admitting: Hematology & Oncology

## 2019-09-24 ENCOUNTER — Other Ambulatory Visit: Payer: Medicare Other

## 2019-09-25 ENCOUNTER — Other Ambulatory Visit: Payer: Self-pay

## 2019-09-25 ENCOUNTER — Inpatient Hospital Stay: Payer: Medicare Other | Attending: Hematology & Oncology

## 2019-09-25 ENCOUNTER — Inpatient Hospital Stay: Payer: Medicare Other

## 2019-09-25 ENCOUNTER — Telehealth: Payer: Self-pay | Admitting: Hematology & Oncology

## 2019-09-25 ENCOUNTER — Encounter: Payer: Self-pay | Admitting: Hematology & Oncology

## 2019-09-25 ENCOUNTER — Inpatient Hospital Stay (HOSPITAL_BASED_OUTPATIENT_CLINIC_OR_DEPARTMENT_OTHER): Payer: Medicare Other | Admitting: Hematology & Oncology

## 2019-09-25 VITALS — BP 146/73 | HR 60 | Resp 17

## 2019-09-25 VITALS — BP 156/92 | HR 63 | Temp 97.1°F | Resp 18 | Wt 217.8 lb

## 2019-09-25 DIAGNOSIS — Z79899 Other long term (current) drug therapy: Secondary | ICD-10-CM | POA: Insufficient documentation

## 2019-09-25 DIAGNOSIS — D751 Secondary polycythemia: Secondary | ICD-10-CM | POA: Insufficient documentation

## 2019-09-25 DIAGNOSIS — E611 Iron deficiency: Secondary | ICD-10-CM | POA: Diagnosis not present

## 2019-09-25 LAB — CMP (CANCER CENTER ONLY)
ALT: 27 U/L (ref 0–44)
AST: 23 U/L (ref 15–41)
Albumin: 4.3 g/dL (ref 3.5–5.0)
Alkaline Phosphatase: 66 U/L (ref 38–126)
Anion gap: 6 (ref 5–15)
BUN: 15 mg/dL (ref 8–23)
CO2: 31 mmol/L (ref 22–32)
Calcium: 10.3 mg/dL (ref 8.9–10.3)
Chloride: 100 mmol/L (ref 98–111)
Creatinine: 0.99 mg/dL (ref 0.61–1.24)
GFR, Est AFR Am: >60 mL/min (ref >60)
GFR, Estimated: >60 mL/min (ref >60)
Glucose, Bld: 233 mg/dL — ABNORMAL HIGH (ref 70–99)
Potassium: 4.8 mmol/L (ref 3.5–5.1)
Sodium: 137 mmol/L (ref 135–145)
Total Bilirubin: 0.7 mg/dL (ref 0.3–1.2)
Total Protein: 6.6 g/dL (ref 6.5–8.1)

## 2019-09-25 LAB — CBC WITH DIFFERENTIAL (CANCER CENTER ONLY)
Abs Immature Granulocytes: 0.02 10*3/uL (ref 0.00–0.07)
Basophils Absolute: 0 10*3/uL (ref 0.0–0.1)
Basophils Relative: 1 %
Eosinophils Absolute: 0.1 10*3/uL (ref 0.0–0.5)
Eosinophils Relative: 2 %
HCT: 47 % (ref 39.0–52.0)
Hemoglobin: 16.6 g/dL (ref 13.0–17.0)
Immature Granulocytes: 0 %
Lymphocytes Relative: 30 %
Lymphs Abs: 1.7 10*3/uL (ref 0.7–4.0)
MCH: 31.9 pg (ref 26.0–34.0)
MCHC: 35.3 g/dL (ref 30.0–36.0)
MCV: 90.2 fL (ref 80.0–100.0)
Monocytes Absolute: 0.6 10*3/uL (ref 0.1–1.0)
Monocytes Relative: 11 %
Neutro Abs: 3.1 10*3/uL (ref 1.7–7.7)
Neutrophils Relative %: 56 %
Platelet Count: 182 10*3/uL (ref 150–400)
RBC: 5.21 MIL/uL (ref 4.22–5.81)
RDW: 12.7 % (ref 11.5–15.5)
WBC Count: 5.6 10*3/uL (ref 4.0–10.5)
nRBC: 0 % (ref 0.0–0.2)

## 2019-09-25 LAB — IRON AND TIBC
Iron: 92 ug/dL (ref 42–163)
Saturation Ratios: 29 % (ref 20–55)
TIBC: 313 ug/dL (ref 202–409)
UIBC: 221 ug/dL (ref 117–376)

## 2019-09-25 LAB — FERRITIN: Ferritin: 50 ng/mL (ref 24–336)

## 2019-09-25 NOTE — Progress Notes (Signed)
Hematology and Oncology Follow Up Visit  Gary Carpenter [id number] 05/11/1941 79 y.o. 09/25/2019   Principle Diagnosis:   Spurious polycythemia  Current Therapy:    Phlebotomy to maintain hematocrit less than 45%  Aspirin 81 mg by mouth daily     Interim History:  Gary Carpenter is back for followup.  We see him every 6 months.  Since we last saw him, he has been doing pretty well.  The big problem that he has are his blood sugars.  His glucose today was 233.  He will talk to his family doctor about this who manages his diabetes.  He is staying active.  He is not traveling which he likes to do.  He does play golf.  He has had no problems with itching.  He has had no headache.  He has had no change in bowel or bladder habits.  There is been no leg swelling.  He has had no rashes.    Overall, his performance status is ECOG 0.  Medications:  Current Outpatient Medications:  .  aspirin 81 MG tablet, Take 81 mg by mouth daily.  , Disp: , Rfl:  .  fish oil-omega-3 fatty acids 1000 MG capsule, Take 1 g by mouth daily.  , Disp: , Rfl:  .  FREESTYLE LITE test strip, U TO CHECK ONCE A DAY UTD, Disp: , Rfl: 3 .  losartan-hydrochlorothiazide (HYZAAR) 100-25 MG per tablet, Take 1 tablet by mouth daily. , Disp: , Rfl:  .  metFORMIN (GLUCOPHAGE-XR) 750 MG 24 hr tablet, TK 1 T PO QD WITH THE EVE MEAL, Disp: , Rfl: 4 .  tadalafil (CIALIS) 10 MG tablet, Take 10 mg by mouth daily as needed.  , Disp: , Rfl:   Allergies:  Allergies  Allergen Reactions  . Lidocaine     "made me loopy"    Past Medical History, Surgical history, Social history, and Family History were reviewed and updated.  Review of Systems: Review of Systems  Constitutional: Negative.   HENT: Negative.   Eyes: Negative.   Respiratory: Negative.   Cardiovascular: Negative.   Gastrointestinal: Negative.   Genitourinary: Negative.   Musculoskeletal: Negative.   Skin: Negative.   Neurological: Negative.    Endo/Heme/Allergies: Negative.   Psychiatric/Behavioral: Negative.      Physical Exam:  weight is 217 lb 12 oz (98.8 kg). His temporal temperature is 97.1 F (36.2 C) (abnormal). His blood pressure is 156/92 (abnormal) and his pulse is 63. His respiration is 18 and oxygen saturation is 100%.   Physical Exam Vitals reviewed.  HENT:     Head: Normocephalic and atraumatic.  Eyes:     Pupils: Pupils are equal, round, and reactive to light.  Cardiovascular:     Rate and Rhythm: Normal rate and regular rhythm.     Heart sounds: Normal heart sounds.  Pulmonary:     Effort: Pulmonary effort is normal.     Breath sounds: Normal breath sounds.  Abdominal:     General: Bowel sounds are normal.     Palpations: Abdomen is soft.  Musculoskeletal:        General: No tenderness or deformity. Normal range of motion.     Cervical back: Normal range of motion.  Lymphadenopathy:     Cervical: No cervical adenopathy.  Skin:    General: Skin is warm and dry.     Findings: No erythema or rash.  Neurological:     Mental Status: He is alert and oriented to person,  place, and time.  Psychiatric:        Behavior: Behavior normal.        Thought Content: Thought content normal.        Judgment: Judgment normal.      Lab Results  Component Value Date   WBC 5.6 09/25/2019   HGB 16.6 09/25/2019   HCT 47.0 09/25/2019   MCV 90.2 09/25/2019   PLT 182 09/25/2019     Chemistry      Component Value Date/Time   NA 137 09/25/2019 0912   NA 143 03/29/2017 0856   NA 137 03/28/2016 0841   K 4.8 09/25/2019 0912   K 4.0 03/29/2017 0856   K 4.2 03/28/2016 0841   CL 100 09/25/2019 0912   CL 101 03/29/2017 0856   CO2 31 09/25/2019 0912   CO2 30 03/29/2017 0856   CO2 25 03/28/2016 0841   BUN 15 09/25/2019 0912   BUN 14 03/29/2017 0856   BUN 16.4 03/28/2016 0841   CREATININE 0.99 09/25/2019 0912   CREATININE 0.8 03/29/2017 0856   CREATININE 1.0 03/28/2016 0841      Component Value Date/Time    CALCIUM 10.3 09/25/2019 0912   CALCIUM 9.7 03/29/2017 0856   CALCIUM 9.7 03/28/2016 0841   ALKPHOS 66 09/25/2019 0912   ALKPHOS 86 (H) 03/29/2017 0856   ALKPHOS 78 03/28/2016 0841   AST 23 09/25/2019 0912   AST 23 03/28/2016 0841   ALT 27 09/25/2019 0912   ALT 41 03/29/2017 0856   ALT 34 03/28/2016 0841   BILITOT 0.7 09/25/2019 0912   BILITOT 1.14 03/28/2016 0841      Impression and Plan: Gary Carpenter is a 79 year old gentleman. He has spurious polycythemia. However, we are keeping his hematocrit below 45%. This does make him feel better.  I will go ahead and phlebotomize him today.    As always, we will see him back in 6 months.  He always knows that he can come back sooner than 6 months if he has any issues.    5/12/202110:13 AM

## 2019-09-25 NOTE — Telephone Encounter (Signed)
Appointments scheduled calendar declined due to My Chart/ 5/12 los

## 2019-09-25 NOTE — Patient Instructions (Signed)
Therapeutic Phlebotomy Therapeutic phlebotomy is the planned removal of blood from a person's body for the purpose of treating a medical condition. The procedure is similar to donating blood. Usually, about a pint (470 mL, or 0.47 L) of blood is removed. The average adult has 9-12 pints (4.3-5.7 L) of blood in the body. Therapeutic phlebotomy may be used to treat the following medical conditions:  Hemochromatosis. This is a condition in which the blood contains too much iron.  Polycythemia vera. This is a condition in which the blood contains too many red blood cells.  Porphyria cutanea tarda. This is a disease in which an important part of hemoglobin is not made properly. It results in the buildup of abnormal amounts of porphyrins in the body.  Sickle cell disease. This is a condition in which the red blood cells form an abnormal crescent shape rather than a round shape. Tell a health care provider about:  Any allergies you have.  All medicines you are taking, including vitamins, herbs, eye drops, creams, and over-the-counter medicines.  Any problems you or family members have had with anesthetic medicines.  Any blood disorders you have.  Any surgeries you have had.  Any medical conditions you have.  Whether you are pregnant or may be pregnant. What are the risks? Generally, this is a safe procedure. However, problems may occur, including:  Nausea or light-headedness.  Low blood pressure (hypotension).  Soreness, bleeding, swelling, or bruising at the needle insertion site.  Infection. What happens before the procedure?  Follow instructions from your health care provider about eating or drinking restrictions.  Ask your health care provider about: ? Changing or stopping your regular medicines. This is especially important if you are taking diabetes medicines or blood thinners (anticoagulants). ? Taking medicines such as aspirin and ibuprofen. These medicines can thin your  blood. Do not take these medicines unless your health care provider tells you to take them. ? Taking over-the-counter medicines, vitamins, herbs, and supplements.  Wear clothing with sleeves that can be raised above the elbow.  Plan to have someone take you home from the hospital or clinic.  You may have a blood sample taken.  Your blood pressure, pulse rate, and breathing rate will be measured. What happens during the procedure?   To lower your risk of infection: ? Your health care team will wash or sanitize their hands. ? Your skin will be cleaned with an antiseptic.  You may be given a medicine to numb the area (local anesthetic).  A tourniquet will be placed on your arm.  A needle will be inserted into one of your veins.  Tubing and a collection bag will be attached to that needle.  Blood will flow through the needle and tubing into the collection bag.  The collection bag will be placed lower than your arm to allow gravity to help the flow of blood into the bag.  You may be asked to open and close your hand slowly and continually during the entire collection.  After the specified amount of blood has been removed from your body, the collection bag and tubing will be clamped.  The needle will be removed from your vein.  Pressure will be held on the site of the needle insertion to stop the bleeding.  A bandage (dressing) will be placed over the needle insertion site. The procedure may vary among health care providers and hospitals. What happens after the procedure?  Your blood pressure, pulse rate, and breathing rate will be   measured after the procedure.  You will be encouraged to drink fluids.  Your recovery will be assessed and monitored.  You can return to your normal activities as told by your health care provider. Summary  Therapeutic phlebotomy is the planned removal of blood from a person's body for the purpose of treating a medical condition.  Therapeutic  phlebotomy may be used to treat hemochromatosis, polycythemia vera, porphyria cutanea tarda, or sickle cell disease.  In the procedure, a needle is inserted and about a pint (470 mL, or 0.47 L) of blood is removed. The average adult has 9-12 pints (4.3-5.7 L) of blood in the body.  This is generally a safe procedure, but it can sometimes cause problems such as nausea, light-headedness, or low blood pressure (hypotension). This information is not intended to replace advice given to you by your health care provider. Make sure you discuss any questions you have with your health care provider. Document Revised: 05/18/2017 Document Reviewed: 05/18/2017 Elsevier Patient Education  2020 [employer]  

## 2019-09-25 NOTE — Progress Notes (Signed)
Gary Carpenter presents today for phlebotomy per MD orders. Phlebotomy procedure started at 1030 and ended at 1036. 510 grams removed via 16 gauge PIV to right outer AC after 1 failed attempt. Patient observed for 30 minutes after procedure without any incident. Patient tolerated procedure well. IV needle removed intact.  Pt declined to stay for 30 minute post procedure observation period. Pt stated he felt fine and was going to finish his water. Pt aware to call with any questions or concerns. Pt verbalized understanding and had no further questions.

## 2019-11-25 DIAGNOSIS — R0989 Other specified symptoms and signs involving the circulatory and respiratory systems: Secondary | ICD-10-CM | POA: Diagnosis not present

## 2019-11-25 DIAGNOSIS — Z87891 Personal history of nicotine dependence: Secondary | ICD-10-CM | POA: Diagnosis not present

## 2019-11-25 DIAGNOSIS — I10 Essential (primary) hypertension: Secondary | ICD-10-CM | POA: Diagnosis not present

## 2019-11-25 DIAGNOSIS — I499 Cardiac arrhythmia, unspecified: Secondary | ICD-10-CM | POA: Diagnosis not present

## 2019-11-25 DIAGNOSIS — E118 Type 2 diabetes mellitus with unspecified complications: Secondary | ICD-10-CM | POA: Diagnosis not present

## 2019-11-25 DIAGNOSIS — D751 Secondary polycythemia: Secondary | ICD-10-CM | POA: Diagnosis not present

## 2019-11-25 DIAGNOSIS — E781 Pure hyperglyceridemia: Secondary | ICD-10-CM | POA: Diagnosis not present

## 2019-11-25 DIAGNOSIS — K5730 Diverticulosis of large intestine without perforation or abscess without bleeding: Secondary | ICD-10-CM | POA: Diagnosis not present

## 2019-11-25 DIAGNOSIS — Z8679 Personal history of other diseases of the circulatory system: Secondary | ICD-10-CM | POA: Diagnosis not present

## 2019-11-25 DIAGNOSIS — Z0001 Encounter for general adult medical examination with abnormal findings: Secondary | ICD-10-CM | POA: Diagnosis not present

## 2019-11-25 DIAGNOSIS — R011 Cardiac murmur, unspecified: Secondary | ICD-10-CM | POA: Diagnosis not present

## 2019-11-25 DIAGNOSIS — M539 Dorsopathy, unspecified: Secondary | ICD-10-CM | POA: Diagnosis not present

## 2019-11-26 ENCOUNTER — Encounter: Payer: Self-pay | Admitting: Internal Medicine

## 2019-12-05 DIAGNOSIS — I501 Left ventricular failure: Secondary | ICD-10-CM | POA: Diagnosis not present

## 2019-12-05 DIAGNOSIS — Z8679 Personal history of other diseases of the circulatory system: Secondary | ICD-10-CM | POA: Diagnosis not present

## 2019-12-05 DIAGNOSIS — R011 Cardiac murmur, unspecified: Secondary | ICD-10-CM | POA: Diagnosis not present

## 2019-12-05 DIAGNOSIS — Z8619 Personal history of other infectious and parasitic diseases: Secondary | ICD-10-CM | POA: Diagnosis not present

## 2020-01-26 ENCOUNTER — Telehealth: Payer: Self-pay

## 2020-01-26 NOTE — Telephone Encounter (Signed)
Left message for pt to call back. Need to reschedule procedure appt to 02/12/20@10 :30am with Dr. Norman Herrlich.

## 2020-01-29 ENCOUNTER — Other Ambulatory Visit: Payer: Self-pay

## 2020-01-29 ENCOUNTER — Ambulatory Visit (AMBULATORY_SURGERY_CENTER): Payer: Self-pay

## 2020-01-29 VITALS — Ht 73.0 in | Wt 217.0 lb

## 2020-01-29 DIAGNOSIS — Z1211 Encounter for screening for malignant neoplasm of colon: Secondary | ICD-10-CM

## 2020-01-29 NOTE — Progress Notes (Signed)
No egg or soy allergy known to patient  No issues with past sedation with any surgeries or procedures No intubation problems in the past  No FH of Malignant Hyperthermia No diet pills per patient No home 02 use per patient  No blood thinners per patient  Pt denies issues with constipation  No A fib or A flutter  EMMI video via MyChart  COVID 19 guidelines implemented in [facility] today with Pt and RN  COVID vaccines completed on 06/2019 per pt;  Due to the COVID-19 pandemic we are asking patients to follow these guidelines. Please only bring one care partner. Please be aware that your care partner may wait in the car in the parking lot or if they feel like they will be too hot to wait in the car, they may wait in the lobby on the 4th floor. All care partners are required to wear a mask the entire time (we do not have any that we can provide them), they need to practice social distancing, and we will do a Covid check for all patient's and care partners when you arrive. Also we will check their temperature and your temperature. If the care partner waits in their car they need to stay in the parking lot the entire time and we will call them on their cell phone when the patient is ready for discharge so they can bring the car to the front of the building. Also all patient's will need to wear a mask into building.  

## 2020-02-12 ENCOUNTER — Ambulatory Visit (AMBULATORY_SURGERY_CENTER): Payer: Medicare Other | Admitting: Internal Medicine

## 2020-02-12 ENCOUNTER — Encounter: Payer: Self-pay | Admitting: Internal Medicine

## 2020-02-12 ENCOUNTER — Encounter: Payer: Medicare Other | Admitting: Internal Medicine

## 2020-02-12 ENCOUNTER — Other Ambulatory Visit: Payer: Self-pay

## 2020-02-12 VITALS — BP 123/70 | HR 64 | Temp 97.8°F | Resp 20 | Ht 74.0 in | Wt 217.0 lb

## 2020-02-12 DIAGNOSIS — Z1211 Encounter for screening for malignant neoplasm of colon: Secondary | ICD-10-CM

## 2020-02-12 DIAGNOSIS — D123 Benign neoplasm of transverse colon: Secondary | ICD-10-CM | POA: Diagnosis not present

## 2020-02-12 DIAGNOSIS — D122 Benign neoplasm of ascending colon: Secondary | ICD-10-CM

## 2020-02-12 DIAGNOSIS — D124 Benign neoplasm of descending colon: Secondary | ICD-10-CM | POA: Diagnosis not present

## 2020-02-12 DIAGNOSIS — Z86010 Personal history of colonic polyps: Secondary | ICD-10-CM

## 2020-02-12 DIAGNOSIS — D120 Benign neoplasm of cecum: Secondary | ICD-10-CM

## 2020-02-12 MED ORDER — SODIUM CHLORIDE 0.9 % IV SOLN: 500.0000 mL | Freq: Once | INTRAVENOUS | Status: DC

## 2020-02-12 NOTE — Progress Notes (Signed)
pt tolerated well. VSS. awake and to recovery. Report given to RN.  

## 2020-02-12 NOTE — Op Note (Signed)
[place] Patient Name: Gary Carpenter Procedure Date: 02/12/2020 10:50 AM MRN: [id number] Endoscopist: Jerene Bears , MD Age: 79 Referring MD:  Date of Birth: 04/25/1941 Gender: Male Account #: [id number] Procedure:                Colonoscopy Indications:              High risk colon cancer surveillance: Personal                            history of colonic polyps with remote adenomas, but                            no adenomas or SSPs at last colonoscopy 10 years ago Medicines:                Monitored Anesthesia Care Procedure:                Pre-Anesthesia Assessment:                           - Prior to the procedure, a History and Physical                            was performed, and patient medications and                            allergies were reviewed. The patient's tolerance of                            previous anesthesia was also reviewed. The risks                            and benefits of the procedure and the sedation                            options and risks were discussed with the patient.                            All questions were answered, and informed consent                            was obtained. Prior Anticoagulants: The patient has                            taken no previous anticoagulant or antiplatelet                            agents. ASA Grade Assessment: II - A patient with                            mild systemic disease. After reviewing the risks                            and benefits, the patient was deemed in  satisfactory condition to undergo the procedure.                           After obtaining informed consent, the colonoscope                            was passed under direct vision. Throughout the                            procedure, the patient's blood pressure, pulse, and                            oxygen saturations were monitored continuously. The                            Colonoscope  was introduced through the anus and                            advanced to the cecum, identified by appendiceal                            orifice and ileocecal valve. The colonoscopy was                            performed without difficulty. The patient tolerated                            the procedure well. The quality of the bowel                            preparation was good. The ileocecal valve,                            appendiceal orifice, and rectum were photographed. Scope In: 10:55:42 AM Scope Out: 11:11:22 AM Scope Withdrawal Time: 0 hours 13 minutes 6 seconds  Total Procedure Duration: 0 hours 15 minutes 40 seconds  Findings:                 The digital rectal exam was normal.                           A 5 mm polyp was found in the ileocecal valve. The                            polyp was sessile. The polyp was removed with a                            cold snare. Resection and retrieval were complete.                           A 5 mm polyp was found in the ascending colon. The                            polyp was  sessile. The polyp was removed with a                            cold snare. Resection and retrieval were complete.                           A 4 mm polyp was found in the transverse colon. The                            polyp was sessile. The polyp was removed with a                            cold snare. Resection and retrieval were complete.                           Two sessile polyps were found in the descending                            colon. The polyps were 3 to 4 mm in size. These                            polyps were removed with a cold snare. Resection                            and retrieval were complete.                           Multiple small and large-mouthed diverticula were                            found in the sigmoid colon.                           Internal hemorrhoids were found during                            retroflexion. The  hemorrhoids were small. Complications:            No immediate complications. Estimated Blood Loss:     Estimated blood loss was minimal. Impression:               - One 5 mm polyp at the ileocecal valve, removed                            with a cold snare. Resected and retrieved.                           - One 5 mm polyp in the ascending colon, removed                            with a cold snare. Resected and retrieved.                           - One 4 mm  polyp in the transverse colon, removed                            with a cold snare. Resected and retrieved.                           - Two 3 to 4 mm polyps in the descending colon,                            removed with a cold snare. Resected and retrieved.                           - Diverticulosis in the sigmoid colon.                           - Internal hemorrhoids. Recommendation:           - Patient has a contact number available for                            emergencies. The signs and symptoms of potential                            delayed complications were discussed with the                            patient. Return to normal activities tomorrow.                            Written discharge instructions were provided to the                            patient.                           - Resume previous diet.                           - Continue present medications.                           - Await pathology results.                           - No recommendation at this time regarding repeat                            colonoscopy due to age at next surveillance                            interval. Jerene Bears, MD 02/12/2020 11:17:48 AM This report has been signed electronically.

## 2020-02-12 NOTE — Patient Instructions (Signed)
Handouts given:  Polyps, Diverticulosis, Hemorrhoids  Resume previous diet Continue present medications  await pathology results\ No repeat colonoscopy needed   YOU HAD AN ENDOSCOPIC PROCEDURE TODAY AT [facility]:   Refer to the procedure report that was given to you for any specific questions about what was found during the examination.  If the procedure report does not answer your questions, please call your gastroenterologist to clarify.  If you requested that your care partner not be given the details of your procedure findings, then the procedure report has been included in a sealed envelope for you to review at your convenience later.  YOU SHOULD EXPECT: Some feelings of bloating in the abdomen. Passage of more gas than usual.  Walking can help get rid of the air that was put into your GI tract during the procedure and reduce the bloating. If you had a lower endoscopy (such as a colonoscopy or flexible sigmoidoscopy) you may notice spotting of blood in your stool or on the toilet paper. If you underwent a bowel prep for your procedure, you may not have a normal bowel movement for a few days.  Please Note:  You might notice some irritation and congestion in your nose or some drainage.  This is from the oxygen used during your procedure.  There is no need for concern and it should clear up in a day or so.  SYMPTOMS TO REPORT IMMEDIATELY:   Following lower endoscopy (colonoscopy or flexible sigmoidoscopy):  Excessive amounts of blood in the stool  Significant tenderness or worsening of abdominal pains  Swelling of the abdomen that is new, acute  Fever of 100F or higher   For urgent or emergent issues, a gastroenterologist can be reached at any hour by calling [phone]. Do not use MyChart messaging for urgent concerns.    DIET:  We do recommend a small meal at first, but then you may proceed to your regular diet.  Drink plenty of fluids but you should avoid  alcoholic beverages for 24 hours.  ACTIVITY:  You should plan to take it easy for the rest of today and you should NOT DRIVE or use heavy machinery until tomorrow (because of the sedation medicines used during the test).    FOLLOW UP: Our staff will call the number listed on your records 48-72 hours following your procedure to check on you and address any questions or concerns that you may have regarding the information given to you following your procedure. If we do not reach you, we will leave a message.  We will attempt to reach you two times.  During this call, we will ask if you have developed any symptoms of COVID 19. If you develop any symptoms (ie: fever, flu-like symptoms, shortness of breath, cough etc.) before then, please call [phone].  If you test positive for Covid 19 in the 2 weeks post procedure, please call and report this information to [place].    If any biopsies were taken you will be contacted by phone or by letter within the next 1-3 weeks.  Please call us at [phone] if you have not heard about the biopsies in 3 weeks.    SIGNATURES/CONFIDENTIALITY: You and/or your care partner have signed paperwork which will be entered into your electronic medical record.  These signatures attest to the fact that that the information above on your After Visit Summary has been reviewed and is understood.  Full responsibility of the confidentiality of this discharge information lies  with you and/or your care-partner.

## 2020-02-12 NOTE — Progress Notes (Signed)
Called to room to assist during endoscopic procedure.  Patient ID and intended procedure confirmed with present staff. Received instructions for my participation in the procedure from the performing physician.  

## 2020-02-12 NOTE — Progress Notes (Signed)
VS-CW  Pt's states no medical or surgical changes since previsit or office visit.  

## 2020-02-14 ENCOUNTER — Telehealth: Payer: Self-pay

## 2020-02-14 NOTE — Telephone Encounter (Signed)
First post procedure follow up call, no answer 

## 2020-02-14 NOTE — Telephone Encounter (Signed)
Left message on 2nd follow up call. 

## 2020-02-19 ENCOUNTER — Encounter: Payer: Self-pay | Admitting: Internal Medicine

## 2020-02-26 DIAGNOSIS — I10 Essential (primary) hypertension: Secondary | ICD-10-CM | POA: Diagnosis not present

## 2020-02-26 DIAGNOSIS — E781 Pure hyperglyceridemia: Secondary | ICD-10-CM | POA: Diagnosis not present

## 2020-02-26 DIAGNOSIS — Z6829 Body mass index (BMI) 29.0-29.9, adult: Secondary | ICD-10-CM | POA: Diagnosis not present

## 2020-02-26 DIAGNOSIS — E119 Type 2 diabetes mellitus without complications: Secondary | ICD-10-CM | POA: Diagnosis not present

## 2020-02-26 DIAGNOSIS — E118 Type 2 diabetes mellitus with unspecified complications: Secondary | ICD-10-CM | POA: Diagnosis not present

## 2020-02-26 DIAGNOSIS — Z23 Encounter for immunization: Secondary | ICD-10-CM | POA: Diagnosis not present

## 2020-03-25 ENCOUNTER — Inpatient Hospital Stay: Payer: Medicare Other | Attending: Hematology & Oncology

## 2020-03-25 ENCOUNTER — Encounter: Payer: Self-pay | Admitting: Hematology & Oncology

## 2020-03-25 ENCOUNTER — Other Ambulatory Visit: Payer: Self-pay

## 2020-03-25 ENCOUNTER — Inpatient Hospital Stay: Payer: Medicare Other

## 2020-03-25 ENCOUNTER — Inpatient Hospital Stay (HOSPITAL_BASED_OUTPATIENT_CLINIC_OR_DEPARTMENT_OTHER): Payer: Medicare Other | Admitting: Hematology & Oncology

## 2020-03-25 VITALS — BP 168/94 | HR 66 | Temp 98.1°F | Resp 20 | Wt 216.4 lb

## 2020-03-25 DIAGNOSIS — D751 Secondary polycythemia: Secondary | ICD-10-CM

## 2020-03-25 DIAGNOSIS — E611 Iron deficiency: Secondary | ICD-10-CM | POA: Diagnosis not present

## 2020-03-25 DIAGNOSIS — Z79899 Other long term (current) drug therapy: Secondary | ICD-10-CM | POA: Diagnosis not present

## 2020-03-25 LAB — FERRITIN: Ferritin: 88 ng/mL (ref 24–336)

## 2020-03-25 LAB — CMP (CANCER CENTER ONLY)
ALT: 32 U/L (ref 0–44)
AST: 29 U/L (ref 15–41)
Albumin: 4.3 g/dL (ref 3.5–5.0)
Alkaline Phosphatase: 64 U/L (ref 38–126)
Anion gap: 7 (ref 5–15)
BUN: 17 mg/dL (ref 8–23)
CO2: 31 mmol/L (ref 22–32)
Calcium: 10.9 mg/dL — ABNORMAL HIGH (ref 8.9–10.3)
Chloride: 97 mmol/L — ABNORMAL LOW (ref 98–111)
Creatinine: 0.98 mg/dL (ref 0.61–1.24)
GFR, Estimated: >60 mL/min (ref >60)
Glucose, Bld: 221 mg/dL — ABNORMAL HIGH (ref 70–99)
Potassium: 4.4 mmol/L (ref 3.5–5.1)
Sodium: 135 mmol/L (ref 135–145)
Total Bilirubin: 0.9 mg/dL (ref 0.3–1.2)
Total Protein: 7.2 g/dL (ref 6.5–8.1)

## 2020-03-25 LAB — IRON AND TIBC
Iron: 117 ug/dL (ref 42–163)
Saturation Ratios: 36 % (ref 20–55)
TIBC: 325 ug/dL (ref 202–409)
UIBC: 208 ug/dL (ref 117–376)

## 2020-03-25 LAB — CBC WITH DIFFERENTIAL (CANCER CENTER ONLY)
Abs Immature Granulocytes: 0.06 10*3/uL (ref 0.00–0.07)
Basophils Absolute: 0 10*3/uL (ref 0.0–0.1)
Basophils Relative: 1 %
Eosinophils Absolute: 0.1 10*3/uL (ref 0.0–0.5)
Eosinophils Relative: 2 %
HCT: 47.4 % (ref 39.0–52.0)
Hemoglobin: 17 g/dL (ref 13.0–17.0)
Immature Granulocytes: 1 %
Lymphocytes Relative: 26 %
Lymphs Abs: 1.5 10*3/uL (ref 0.7–4.0)
MCH: 31.8 pg (ref 26.0–34.0)
MCHC: 35.9 g/dL (ref 30.0–36.0)
MCV: 88.8 fL (ref 80.0–100.0)
Monocytes Absolute: 0.6 10*3/uL (ref 0.1–1.0)
Monocytes Relative: 10 %
Neutro Abs: 3.5 10*3/uL (ref 1.7–7.7)
Neutrophils Relative %: 60 %
Platelet Count: 199 10*3/uL (ref 150–400)
RBC: 5.34 MIL/uL (ref 4.22–5.81)
RDW: 12.3 % (ref 11.5–15.5)
WBC Count: 5.8 10*3/uL (ref 4.0–10.5)
nRBC: 0 % (ref 0.0–0.2)

## 2020-03-25 LAB — RETICULOCYTES
Immature Retic Fract: 11.3 % (ref 2.3–15.9)
RBC.: 5.27 MIL/uL (ref 4.22–5.81)
Retic Count, Absolute: 132.8 10*3/uL (ref 19.0–186.0)
Retic Ct Pct: 2.5 % (ref 0.4–3.1)

## 2020-03-25 NOTE — Progress Notes (Signed)
Hematology and Oncology Follow Up Visit  Gary Carpenter [id number] 1940-08-09 79 y.o. 03/25/2020   Principle Diagnosis:   Spurious polycythemia  Current Therapy:    Phlebotomy to maintain hematocrit less than 45%  Aspirin 81 mg by mouth daily     Interim History:  Mr.  Gary Carpenter is back for followup.  We see him every 6 months.  We always phlebotomize him when he is with [place].  He has been doing well.  He was has a nice Thanksgiving with his family.  He has had no problem with headache.  His blood sugars still are on the high side.  I think this is good be his problem long-term.  I know he sees Dr. Deland Pretty.  There is been no pruritus.  He has had no change in bowel or bladder habits.  He has had no cough.  He has had no nausea or vomiting.  There is been no change in vision.  Overall, his performance status is ECOG 0.    Medications:  Current Outpatient Medications:  .  aspirin 81 MG tablet, Take 81 mg by mouth daily.  , Disp: , Rfl:  .  fish oil-omega-3 fatty acids 1000 MG capsule, Take 1 g by mouth daily.  , Disp: , Rfl:  .  FREESTYLE LITE test strip, U TO CHECK ONCE A DAY UTD, Disp: , Rfl: 3 .  hydrochlorothiazide (HYDRODIURIL) 25 MG tablet, Take 25 mg by mouth every morning., Disp: , Rfl:  .  losartan (COZAAR) 100 MG tablet, Take 100 mg by mouth daily., Disp: , Rfl:  .  metFORMIN (GLUCOPHAGE-XR) 750 MG 24 hr tablet, TK 1 T PO QD WITH THE EVE MEAL, Disp: , Rfl: 4 .  tadalafil (CIALIS) 10 MG tablet, Take 10 mg by mouth daily as needed.   (Patient not taking: Reported on 03/25/2020), Disp: , Rfl:   Allergies:  Allergies  Allergen Reactions  . Lidocaine     "made me loopy"    Past Medical History, Surgical history, Social history, and Family History were reviewed and updated.  Review of Systems: Review of Systems  Constitutional: Negative.   HENT: Negative.   Eyes: Negative.   Respiratory: Negative.   Cardiovascular: Negative.   Gastrointestinal: Negative.    Genitourinary: Negative.   Musculoskeletal: Negative.   Skin: Negative.   Neurological: Negative.   Endo/Heme/Allergies: Negative.   Psychiatric/Behavioral: Negative.      Physical Exam:  weight is 216 lb 6.4 oz (98.2 kg). His oral temperature is 98.1 F (36.7 C). His blood pressure is 168/94 (abnormal) and his pulse is 66. His respiration is 20 and oxygen saturation is 99%.   Physical Exam Vitals reviewed.  HENT:     Head: Normocephalic and atraumatic.  Eyes:     Pupils: Pupils are equal, round, and reactive to light.  Cardiovascular:     Rate and Rhythm: Normal rate and regular rhythm.     Heart sounds: Normal heart sounds.  Pulmonary:     Effort: Pulmonary effort is normal.     Breath sounds: Normal breath sounds.  Abdominal:     General: Bowel sounds are normal.     Palpations: Abdomen is soft.  Musculoskeletal:        General: No tenderness or deformity. Normal range of motion.     Cervical back: Normal range of motion.  Lymphadenopathy:     Cervical: No cervical adenopathy.  Skin:    General: Skin is warm and dry.  Findings: No erythema or rash.  Neurological:     Mental Status: He is alert and oriented to person, place, and time.  Psychiatric:        Behavior: Behavior normal.        Thought Content: Thought content normal.        Judgment: Judgment normal.      Lab Results  Component Value Date   WBC 5.8 03/25/2020   HGB 17.0 03/25/2020   HCT 47.4 03/25/2020   MCV 88.8 03/25/2020   PLT 199 03/25/2020     Chemistry      Component Value Date/Time   NA 135 03/25/2020 0859   NA 143 03/29/2017 0856   NA 137 03/28/2016 0841   K 4.4 03/25/2020 0859   K 4.0 03/29/2017 0856   K 4.2 03/28/2016 0841   CL 97 (L) 03/25/2020 0859   CL 101 03/29/2017 0856   CO2 31 03/25/2020 0859   CO2 30 03/29/2017 0856   CO2 25 03/28/2016 0841   BUN 17 03/25/2020 0859   BUN 14 03/29/2017 0856   BUN 16.4 03/28/2016 0841   CREATININE 0.98 03/25/2020 0859    CREATININE 0.8 03/29/2017 0856   CREATININE 1.0 03/28/2016 0841      Component Value Date/Time   CALCIUM 10.9 (H) 03/25/2020 0859   CALCIUM 9.7 03/29/2017 0856   CALCIUM 9.7 03/28/2016 0841   ALKPHOS 64 03/25/2020 0859   ALKPHOS 86 (H) 03/29/2017 0856   ALKPHOS 78 03/28/2016 0841   AST 29 03/25/2020 0859   AST 23 03/28/2016 0841   ALT 32 03/25/2020 0859   ALT 41 03/29/2017 0856   ALT 34 03/28/2016 0841   BILITOT 0.9 03/25/2020 0859   BILITOT 1.14 03/28/2016 0841      Impression and Plan: Mr. Venning is a 79 year old gentleman. He has spurious polycythemia. However, we are keeping his hematocrit below 45%. This does make him feel better.  I will go ahead and phlebotomize him today.    As always, we will see him back in 6 months.  He always knows that he can come back sooner than 6 months if he has any issues.    11/10/20219:37 AM

## 2020-03-25 NOTE — Progress Notes (Signed)
Gary Carpenter presents today for phlebotomy per MD orders. Phlebotomy procedure started at 1014 and ended at 1018. 516 cc removed via 16 G needle at R antecubital site. Patient tolerated procedure well. He refused to wait 30 minutes post phlebotomy, was stable and asymptomatic at discharge.

## 2020-03-25 NOTE — Patient Instructions (Signed)

## 2020-03-27 ENCOUNTER — Other Ambulatory Visit: Payer: Medicare Other

## 2020-03-27 ENCOUNTER — Ambulatory Visit: Payer: Medicare Other | Admitting: Hematology & Oncology

## 2020-04-22 DIAGNOSIS — Z23 Encounter for immunization: Secondary | ICD-10-CM | POA: Diagnosis not present

## 2020-09-09 DIAGNOSIS — D485 Neoplasm of uncertain behavior of skin: Secondary | ICD-10-CM | POA: Diagnosis not present

## 2020-09-09 DIAGNOSIS — C44519 Basal cell carcinoma of skin of other part of trunk: Secondary | ICD-10-CM | POA: Diagnosis not present

## 2020-09-09 DIAGNOSIS — L821 Other seborrheic keratosis: Secondary | ICD-10-CM | POA: Diagnosis not present

## 2020-09-09 DIAGNOSIS — D225 Melanocytic nevi of trunk: Secondary | ICD-10-CM | POA: Diagnosis not present

## 2020-09-09 DIAGNOSIS — Z85828 Personal history of other malignant neoplasm of skin: Secondary | ICD-10-CM | POA: Diagnosis not present

## 2020-09-09 DIAGNOSIS — D1801 Hemangioma of skin and subcutaneous tissue: Secondary | ICD-10-CM | POA: Diagnosis not present

## 2020-09-09 DIAGNOSIS — L820 Inflamed seborrheic keratosis: Secondary | ICD-10-CM | POA: Diagnosis not present

## 2020-09-16 DIAGNOSIS — E781 Pure hyperglyceridemia: Secondary | ICD-10-CM | POA: Diagnosis not present

## 2020-09-16 DIAGNOSIS — E119 Type 2 diabetes mellitus without complications: Secondary | ICD-10-CM | POA: Diagnosis not present

## 2020-09-16 DIAGNOSIS — I10 Essential (primary) hypertension: Secondary | ICD-10-CM | POA: Diagnosis not present

## 2020-09-16 DIAGNOSIS — Z6829 Body mass index (BMI) 29.0-29.9, adult: Secondary | ICD-10-CM | POA: Diagnosis not present

## 2020-09-22 ENCOUNTER — Ambulatory Visit: Payer: Medicare Other | Admitting: Hematology & Oncology

## 2020-09-22 ENCOUNTER — Other Ambulatory Visit: Payer: Medicare Other

## 2020-09-23 ENCOUNTER — Inpatient Hospital Stay: Payer: Medicare Other

## 2020-09-23 ENCOUNTER — Inpatient Hospital Stay: Payer: Medicare Other | Admitting: Hematology & Oncology

## 2020-09-23 ENCOUNTER — Telehealth: Payer: Self-pay

## 2020-09-23 NOTE — Telephone Encounter (Signed)
Pt came in late for his appts on 5/11, advised we can work him in and have a wait or r/s, pt has r/s and a calendar has been printed for him   Kasie Leccese

## 2020-11-02 ENCOUNTER — Inpatient Hospital Stay (HOSPITAL_BASED_OUTPATIENT_CLINIC_OR_DEPARTMENT_OTHER): Payer: Medicare Other | Admitting: Hematology & Oncology

## 2020-11-02 ENCOUNTER — Encounter: Payer: Self-pay | Admitting: Hematology & Oncology

## 2020-11-02 ENCOUNTER — Other Ambulatory Visit: Payer: Self-pay

## 2020-11-02 ENCOUNTER — Telehealth: Payer: Self-pay

## 2020-11-02 ENCOUNTER — Inpatient Hospital Stay: Payer: Medicare Other | Attending: Hematology & Oncology

## 2020-11-02 ENCOUNTER — Inpatient Hospital Stay: Payer: Medicare Other

## 2020-11-02 VITALS — BP 166/80 | HR 62 | Temp 97.8°F | Resp 18 | Wt 215.4 lb

## 2020-11-02 DIAGNOSIS — Z7982 Long term (current) use of aspirin: Secondary | ICD-10-CM | POA: Diagnosis not present

## 2020-11-02 DIAGNOSIS — D508 Other iron deficiency anemias: Secondary | ICD-10-CM

## 2020-11-02 DIAGNOSIS — D751 Secondary polycythemia: Secondary | ICD-10-CM | POA: Diagnosis not present

## 2020-11-02 DIAGNOSIS — R739 Hyperglycemia, unspecified: Secondary | ICD-10-CM | POA: Diagnosis not present

## 2020-11-02 LAB — CMP (CANCER CENTER ONLY)
ALT: 26 U/L (ref 0–44)
AST: 23 U/L (ref 15–41)
Albumin: 4.3 g/dL (ref 3.5–5.0)
Alkaline Phosphatase: 61 U/L (ref 38–126)
Anion gap: 5 (ref 5–15)
BUN: 13 mg/dL (ref 8–23)
CO2: 32 mmol/L (ref 22–32)
Calcium: 10.1 mg/dL (ref 8.9–10.3)
Chloride: 99 mmol/L (ref 98–111)
Creatinine: 0.93 mg/dL (ref 0.61–1.24)
GFR, Estimated: >60 mL/min (ref >60)
Glucose, Bld: 195 mg/dL — ABNORMAL HIGH (ref 70–99)
Potassium: 4.6 mmol/L (ref 3.5–5.1)
Sodium: 136 mmol/L (ref 135–145)
Total Bilirubin: 0.9 mg/dL (ref 0.3–1.2)
Total Protein: 7 g/dL (ref 6.5–8.1)

## 2020-11-02 LAB — CBC WITH DIFFERENTIAL (CANCER CENTER ONLY)
Abs Immature Granulocytes: 0.02 10*3/uL (ref 0.00–0.07)
Basophils Absolute: 0 10*3/uL (ref 0.0–0.1)
Basophils Relative: 1 %
Eosinophils Absolute: 0.2 10*3/uL (ref 0.0–0.5)
Eosinophils Relative: 3 %
HCT: 48 % (ref 39.0–52.0)
Hemoglobin: 17.2 g/dL — ABNORMAL HIGH (ref 13.0–17.0)
Immature Granulocytes: 0 %
Lymphocytes Relative: 30 %
Lymphs Abs: 1.8 10*3/uL (ref 0.7–4.0)
MCH: 32.1 pg (ref 26.0–34.0)
MCHC: 35.8 g/dL (ref 30.0–36.0)
MCV: 89.7 fL (ref 80.0–100.0)
Monocytes Absolute: 0.7 10*3/uL (ref 0.1–1.0)
Monocytes Relative: 11 %
Neutro Abs: 3.3 10*3/uL (ref 1.7–7.7)
Neutrophils Relative %: 55 %
Platelet Count: 181 10*3/uL (ref 150–400)
RBC: 5.35 MIL/uL (ref 4.22–5.81)
RDW: 12.8 % (ref 11.5–15.5)
WBC Count: 6 10*3/uL (ref 4.0–10.5)
nRBC: 0 % (ref 0.0–0.2)

## 2020-11-02 LAB — IRON AND TIBC
Iron: 117 ug/dL (ref 45–182)
Saturation Ratios: 32 % (ref 17.9–39.5)
TIBC: 364 ug/dL (ref 250–450)
UIBC: 247 ug/dL

## 2020-11-02 LAB — FERRITIN: Ferritin: 43 ng/mL (ref 24–336)

## 2020-11-02 NOTE — Progress Notes (Signed)
Hematology and Oncology Follow Up Visit  Gary Carpenter [id number] 1940/11/20 80 y.o. 11/02/2020   Principle Diagnosis:  Spurious polycythemia  Current Therapy:   Phlebotomy to maintain hematocrit less than 45% Aspirin 81 mg by mouth daily     Interim History:  [name] is back for followup.  We last saw him back in November.  Since then, he has been doing pretty well.  As always, he is quite busy.  It sounds like he will do a little bit of traveling this summer.  He is not sure where he would like to go.  He has had no problems with cough or shortness of breath.  He does have hyperglycemia.  His blood sugars are under pretty good control.  He says his last hemoglobin A1c was 7.2.  He has had no problems with rashes.  He has had no change in bowel or bladder habits.  He has been no issues with cough.  Thankfully, he has been no issues with COVID.  Overall, his performance status is ECOG 0.    Medications:  Current Outpatient Medications:    aspirin 81 MG tablet, Take 81 mg by mouth daily.  , Disp: , Rfl:    fish oil-omega-3 fatty acids 1000 MG capsule, Take 1 g by mouth daily.  , Disp: , Rfl:    FREESTYLE LITE test strip, U TO CHECK ONCE A DAY UTD, Disp: , Rfl: 3   hydrochlorothiazide (HYDRODIURIL) 25 MG tablet, Take 25 mg by mouth every morning., Disp: , Rfl:    losartan (COZAAR) 100 MG tablet, Take 100 mg by mouth daily., Disp: , Rfl:    metFORMIN (GLUCOPHAGE-XR) 750 MG 24 hr tablet, TK 1 T PO QD WITH THE EVE MEAL, Disp: , Rfl: 4   tadalafil (CIALIS) 10 MG tablet, Take 10 mg by mouth daily as needed.   (Patient not taking: Reported on 03/25/2020), Disp: , Rfl:   Allergies:  Allergies  Allergen Reactions   Lidocaine     "made me loopy"    Past Medical History, Surgical history, Social history, and Family History were reviewed and updated.  Review of Systems: Review of Systems  Constitutional: Negative.   HENT: Negative.    Eyes: Negative.   Respiratory: Negative.     Cardiovascular: Negative.   Gastrointestinal: Negative.   Genitourinary: Negative.   Musculoskeletal: Negative.   Skin: Negative.   Neurological: Negative.   Endo/Heme/Allergies: Negative.   Psychiatric/Behavioral: Negative.      Physical Exam:  weight is 215 lb 6.4 oz (97.7 kg). His oral temperature is 97.8 F (36.6 C). His blood pressure is 166/80 (abnormal) and his pulse is 62. His respiration is 18 and oxygen saturation is 100%.   Physical Exam Vitals reviewed.  HENT:     Head: Normocephalic and atraumatic.  Eyes:     Pupils: Pupils are equal, round, and reactive to light.  Cardiovascular:     Rate and Rhythm: Normal rate and regular rhythm.     Heart sounds: Normal heart sounds.  Pulmonary:     Effort: Pulmonary effort is normal.     Breath sounds: Normal breath sounds.  Abdominal:     General: Bowel sounds are normal.     Palpations: Abdomen is soft.  Musculoskeletal:        General: No tenderness or deformity. Normal range of motion.     Cervical back: Normal range of motion.  Lymphadenopathy:     Cervical: No cervical adenopathy.  Skin:  General: Skin is warm and dry.     Findings: No erythema or rash.  Neurological:     Mental Status: He is alert and oriented to person, place, and time.  Psychiatric:        Behavior: Behavior normal.        Thought Content: Thought content normal.        Judgment: Judgment normal.     Lab Results  Component Value Date   WBC 6.0 11/02/2020   HGB 17.2 (H) 11/02/2020   HCT 48.0 11/02/2020   MCV 89.7 11/02/2020   PLT 181 11/02/2020     Chemistry      Component Value Date/Time   NA 136 11/02/2020 0837   NA 143 03/29/2017 0856   NA 137 03/28/2016 0841   K 4.6 11/02/2020 0837   K 4.0 03/29/2017 0856   K 4.2 03/28/2016 0841   CL 99 11/02/2020 0837   CL 101 03/29/2017 0856   CO2 32 11/02/2020 0837   CO2 30 03/29/2017 0856   CO2 25 03/28/2016 0841   BUN 13 11/02/2020 0837   BUN 14 03/29/2017 0856   BUN 16.4  03/28/2016 0841   CREATININE 0.93 11/02/2020 0837   CREATININE 0.8 03/29/2017 0856   CREATININE 1.0 03/28/2016 0841      Component Value Date/Time   CALCIUM 10.1 11/02/2020 0837   CALCIUM 9.7 03/29/2017 0856   CALCIUM 9.7 03/28/2016 0841   ALKPHOS 61 11/02/2020 0837   ALKPHOS 86 (H) 03/29/2017 0856   ALKPHOS 78 03/28/2016 0841   AST 23 11/02/2020 0837   AST 23 03/28/2016 0841   ALT 26 11/02/2020 0837   ALT 41 03/29/2017 0856   ALT 34 03/28/2016 0841   BILITOT 0.9 11/02/2020 0837   BILITOT 1.14 03/28/2016 0841      Impression and Plan: Gary Carpenter is a 80 year old gentleman. He has spurious polycythemia. However, we are keeping his hematocrit below 45%. This does make him feel better.  I will go ahead and phlebotomize him today.    As always, we will see him back in 6 months.  He always knows that he can come back sooner than 6 months if he has any issues.    6/20/20229:34 AM

## 2020-11-02 NOTE — Progress Notes (Signed)
Gary Carpenter presents today for phlebotomy per MD orders. Phlebotomy procedure started at 1000 and ended at 1017. 527 grams removed from rt [facility] using 16g phlebotomy kit.  Patient observed for 30 minutes after procedure without any incident. Patient tolerated procedure well. IV needle removed intact.

## 2020-11-02 NOTE — Telephone Encounter (Signed)
Appts made per 11/02/20 los and pt to gain sch in chemo/avs and through [employer]

## 2020-11-02 NOTE — Patient Instructions (Signed)

## 2020-11-17 ENCOUNTER — Encounter: Payer: Self-pay | Admitting: Family Medicine

## 2020-11-24 NOTE — Progress Notes (Signed)
I, Peterson Lombard, LAT, ATC acting as a scribe for Lynne Leader, MD.  Subjective:    I'm seeing this patient as a consultation for: Dr. Deland Pretty. Note will be routed back to referring provider/PCP.  CC: Right buttock/low back pain  HPI: Pt is a 80 y/o male c/o pain in his R buttock x 4-5 months. Pt states that pain to originate on the R-side low back and into R buttock. Pt reports the pain is most typically along the posterior aspect of the R leg.  Low back pain: yes Radiating pain: yes LE numbness/tingling: no LE weakness: no Aggravates: sitting Treatments tried: stretching, rolling on a ball   Past medical history, Surgical history, Family history, Social history, Allergies, and medications have been entered into the medical record, reviewed.   Review of Systems: No new headache, visual changes, nausea, vomiting, diarrhea, constipation, dizziness, abdominal pain, skin rash, fevers, chills, night sweats, weight loss, swollen lymph nodes, body aches, joint swelling, muscle aches, chest pain, shortness of breath, mood changes, visual or auditory hallucinations.   Objective:    Vitals:   11/25/20 1324  BP: 140/82  Pulse: 96  SpO2: 96%   General: Well Developed, well nourished, and in no acute distress.  Neuro/Psych: Alert and oriented x3, extra-ocular muscles intact, able to move all 4 extremities, sensation grossly intact. Skin: Warm and dry, no rashes noted.  Respiratory: Not using accessory muscles, speaking in full sentences, trachea midline.  Cardiovascular: Pulses palpable, no extremity edema. Abdomen: Does not appear distended. MSK: L-spine nontender midline. Normal lumbar motion. Minimally positive slump test right side. Right hip normal. Minimally tender to palpation greater trochanter. Hip abduction and external rotation strength mildly reduced.   Lab and Radiology Results X-ray images lumbar spine obtained today personally and independently  interpreted Diffuse DDD.  No severe changes.  No acute fractures. Await formal radiology review  Impression and Recommendations:    Assessment and Plan: 80 y.o. male with right lumbar radiculopathy S1 or L5 dermatomal pattern.  Plan for physical therapy and short course of prednisone.  Discussed gabapentin.  His symptoms are not very bothersome at bedtime as occurring only with driving and with activity therefore gabapentin probably will be very helpful at this time.  Plan to obtain x-ray today as well.  Check back in about 6 weeks.  If not better would proceed with MRI for potential injection planning.  PDMP not reviewed this encounter. Orders Placed This Encounter  Procedures   DG Lumbar Spine 2-3 Views    Standing Status:   Future    Number of Occurrences:   1    Standing Expiration Date:   11/25/2021    Order Specific Question:   Reason for Exam (SYMPTOM  OR DIAGNOSIS REQUIRED)    Answer:   eval lumbar radiculopathy right    Order Specific Question:   Preferred imaging location?    Answer:   [facility]   Ambulatory referral to Physical Therapy    Referral Priority:   Routine    Referral Type:   Physical Medicine    Referral Reason:   Specialty Services Required    Requested Specialty:   Physical Therapy    Number of Visits Requested:   1   Meds ordered this encounter  Medications   predniSONE (DELTASONE) 50 MG tablet    Sig: Take 1 tablet (50 mg total) by mouth daily.    Dispense:  5 tablet    Refill:  0  Discussed warning signs or symptoms. Please see discharge instructions. Patient expresses understanding.   The above documentation has been reviewed and is accurate and complete Lynne Leader, M.D.

## 2020-11-25 ENCOUNTER — Ambulatory Visit (INDEPENDENT_AMBULATORY_CARE_PROVIDER_SITE_OTHER): Payer: Medicare Other | Admitting: Family Medicine

## 2020-11-25 ENCOUNTER — Ambulatory Visit (INDEPENDENT_AMBULATORY_CARE_PROVIDER_SITE_OTHER): Payer: Medicare Other

## 2020-11-25 ENCOUNTER — Other Ambulatory Visit: Payer: Self-pay

## 2020-11-25 VITALS — BP 140/82 | HR 96 | Ht 74.0 in | Wt 216.6 lb

## 2020-11-25 DIAGNOSIS — M5431 Sciatica, right side: Secondary | ICD-10-CM

## 2020-11-25 DIAGNOSIS — M545 Low back pain, unspecified: Secondary | ICD-10-CM | POA: Diagnosis not present

## 2020-11-25 MED ORDER — PREDNISONE 50 MG PO TABS: 50.0000 mg | ORAL_TABLET | Freq: Every day | ORAL | 0 refills | Status: DC

## 2020-11-25 NOTE — Patient Instructions (Signed)
Thank you for coming in today.   Please get an Xray today before you leave   I've referred you to Physical Therapy.  Let us know if you don't hear from them in one week.   Try the prednisone.   Recheck in about 6 weeks.   If this is not working let me know sooner.  Next step is probably an MRI to give a better idea of what is wrong and for injection planning.   Sciatica  Sciatica is pain, weakness, tingling, or loss of feeling (numbness) along the sciatic nerve. The sciatic nerve starts in the lower back and goes down the back of each leg. Sciatica usually goes away on its own or with treatment. Sometimes, sciatica may come back (recur). What are the causes? This condition happens when the sciatic nerve is pinched or has pressure put on it. This may be the result of: A disk in between the bones of the spine bulging out too far (herniated disk). Changes in the spinal disks that occur with aging. A condition that affects a muscle in the butt. Extra bone growth near the sciatic nerve. A break (fracture) of the area between your hip bones (pelvis). Pregnancy. Tumor. This is rare. What increases the risk? You are more likely to develop this condition if you: Play sports that put pressure or stress on the spine. Have poor strength and ease of movement (flexibility). Have had a back injury in the past. Have had back surgery. Sit for long periods of time. Do activities that involve bending or lifting over and over again. Are very overweight (obese). What are the signs or symptoms? Symptoms can vary from mild to very bad. They may include: Any of these problems in the lower back, leg, hip, or butt: Mild tingling, loss of feeling, or dull aches. Burning sensations. Sharp pains. Loss of feeling in the back of the calf or the sole of the foot. Leg weakness. Very bad back pain that makes it hard to move. These symptoms may get worse when you cough, sneeze, or laugh. They may alsoget  worse when you sit or stand for long periods of time. How is this treated? This condition often gets better without any treatment. However, treatment may include: Changing or cutting back on physical activity when you have pain. Doing exercises and stretching. Putting ice or heat on the affected area. Medicines that help: To relieve pain and swelling. To relax your muscles. Shots (injections) of medicines that help to relieve pain, irritation, and swelling. Surgery. Follow these instructions at home: Medicines Take over-the-counter and prescription medicines only as told by your doctor. Ask your doctor if the medicine prescribed to you: Requires you to avoid driving or using heavy machinery. Can cause trouble pooping (constipation). You may need to take these steps to prevent or treat trouble pooping: Drink enough fluids to keep your pee (urine) pale yellow. Take over-the-counter or prescription medicines. Eat foods that are high in fiber. These include beans, whole grains, and fresh fruits and vegetables. Limit foods that are high in fat and sugar. These include fried or sweet foods. Managing pain     If told, put ice on the affected area. Put ice in a plastic bag. Place a towel between your skin and the bag. Leave the ice on for 20 minutes, 2-3 times a day. If told, put heat on the affected area. Use the heat source that your doctor tells you to use, such as a moist heat pack or  a heating pad. Place a towel between your skin and the heat source. Leave the heat on for 20-30 minutes. Remove the heat if your skin turns bright red. This is very important if you are unable to feel pain, heat, or cold. You may have a greater risk of getting burned. Activity  Return to your normal activities as told by your doctor. Ask your doctor what activities are safe for you. Avoid activities that make your symptoms worse. Take short rests during the day. When you rest for a long time, do some  physical activity or stretching between periods of rest. Avoid sitting for a long time without moving. Get up and move around at least one time each hour. Exercise and stretch regularly, as told by your doctor. Do not lift anything that is heavier than 10 lb (4.5 kg) while you have symptoms of sciatica. Avoid lifting heavy things even when you do not have symptoms. Avoid lifting heavy things over and over. When you lift objects, always lift in a way that is safe for your body. To do this, you should: Bend your knees. Keep the object close to your body. Avoid twisting.  General instructions Stay at a healthy weight. Wear comfortable shoes that support your feet. Avoid wearing high heels. Avoid sleeping on a mattress that is too soft or too hard. You might have less pain if you sleep on a mattress that is firm enough to support your back. Keep all follow-up visits as told by your doctor. This is important. Contact a doctor if: You have pain that: Wakes you up when you are sleeping. Gets worse when you lie down. Is worse than the pain you have had in the past. Lasts longer than 4 weeks. You lose weight without trying. Get help right away if: You cannot control when you pee (urinate) or poop (have a bowel movement). You have weakness in any of these areas and it gets worse: Lower back. The area between your hip bones. Butt. Legs. You have redness or swelling of your back. You have a burning feeling when you pee. Summary Sciatica is pain, weakness, tingling, or loss of feeling (numbness) along the sciatic nerve. This condition happens when the sciatic nerve is pinched or has pressure put on it. Sciatica can cause pain, tingling, or loss of feeling (numbness) in the lower back, legs, hips, and butt. Treatment often includes rest, exercise, medicines, and putting ice or heat on the affected area. This information is not intended to replace advice given to you by your health care  provider. Make sure you discuss any questions you have with your healthcare provider. Document Revised: 05/21/2018 Document Reviewed: 05/21/2018 Elsevier Patient Education  [place].

## 2020-11-30 DIAGNOSIS — Z125 Encounter for screening for malignant neoplasm of prostate: Secondary | ICD-10-CM | POA: Diagnosis not present

## 2020-11-30 DIAGNOSIS — I10 Essential (primary) hypertension: Secondary | ICD-10-CM | POA: Diagnosis not present

## 2020-11-30 DIAGNOSIS — E119 Type 2 diabetes mellitus without complications: Secondary | ICD-10-CM | POA: Diagnosis not present

## 2020-11-30 DIAGNOSIS — E781 Pure hyperglyceridemia: Secondary | ICD-10-CM | POA: Diagnosis not present

## 2020-11-30 NOTE — Progress Notes (Signed)
Lumbar spine x-ray shows multilevel arthritis changes worse at L2-L3.  However there are other levels that are affected as well.

## 2020-12-02 ENCOUNTER — Other Ambulatory Visit: Payer: Self-pay | Admitting: Internal Medicine

## 2020-12-02 DIAGNOSIS — D751 Secondary polycythemia: Secondary | ICD-10-CM | POA: Diagnosis not present

## 2020-12-02 DIAGNOSIS — Z0001 Encounter for general adult medical examination with abnormal findings: Secondary | ICD-10-CM | POA: Diagnosis not present

## 2020-12-02 DIAGNOSIS — Z6829 Body mass index (BMI) 29.0-29.9, adult: Secondary | ICD-10-CM | POA: Diagnosis not present

## 2020-12-02 DIAGNOSIS — I10 Essential (primary) hypertension: Secondary | ICD-10-CM | POA: Diagnosis not present

## 2020-12-02 DIAGNOSIS — E118 Type 2 diabetes mellitus with unspecified complications: Secondary | ICD-10-CM | POA: Diagnosis not present

## 2020-12-02 DIAGNOSIS — N529 Male erectile dysfunction, unspecified: Secondary | ICD-10-CM | POA: Diagnosis not present

## 2020-12-08 ENCOUNTER — Other Ambulatory Visit: Payer: Self-pay

## 2020-12-08 ENCOUNTER — Telehealth: Payer: Self-pay | Admitting: Family Medicine

## 2020-12-08 MED ORDER — PREDNISONE 20 MG PO TABS: 40.0000 mg | ORAL_TABLET | Freq: Every day | ORAL | 0 refills | Status: DC

## 2020-12-08 NOTE — Telephone Encounter (Signed)
Will do lower dose of 40 mg daily for 5 days but if pain goes back or no improvement need to be seen

## 2020-12-08 NOTE — Telephone Encounter (Signed)
Gary Carpenter pt. Had sciatica pain on his R side and completed Prednisone about 2 weeks ago. The R side is better but pain has moved to the L.  Pt requesting another 5 doses of Prednisone. Pt informed of Dr. Clovis Riley absence and that request was being sent to Dr. Tamala Julian.

## 2020-12-09 NOTE — Telephone Encounter (Signed)
Sent patient MyChart message with information.  

## 2020-12-14 ENCOUNTER — Other Ambulatory Visit: Payer: Self-pay

## 2020-12-14 ENCOUNTER — Encounter: Payer: Self-pay | Admitting: Rehabilitative and Restorative Service Providers"

## 2020-12-14 ENCOUNTER — Ambulatory Visit (INDEPENDENT_AMBULATORY_CARE_PROVIDER_SITE_OTHER): Payer: Medicare Other | Admitting: Rehabilitative and Restorative Service Providers"

## 2020-12-14 DIAGNOSIS — G8929 Other chronic pain: Secondary | ICD-10-CM | POA: Diagnosis not present

## 2020-12-14 DIAGNOSIS — M6281 Muscle weakness (generalized): Secondary | ICD-10-CM

## 2020-12-14 DIAGNOSIS — M79605 Pain in left leg: Secondary | ICD-10-CM | POA: Diagnosis not present

## 2020-12-14 DIAGNOSIS — R262 Difficulty in walking, not elsewhere classified: Secondary | ICD-10-CM | POA: Diagnosis not present

## 2020-12-14 DIAGNOSIS — M5441 Lumbago with sciatica, right side: Secondary | ICD-10-CM | POA: Diagnosis not present

## 2020-12-14 DIAGNOSIS — M79604 Pain in right leg: Secondary | ICD-10-CM

## 2020-12-14 DIAGNOSIS — M5442 Lumbago with sciatica, left side: Secondary | ICD-10-CM

## 2020-12-14 DIAGNOSIS — R293 Abnormal posture: Secondary | ICD-10-CM

## 2020-12-14 NOTE — Therapy (Signed)
[facility] [address] [place], [place], [address] Phone: [phone]   Fax:  [phone]  Physical Therapy Evaluation  Patient Details  Name: Gary Carpenter MRN: [id number] Date of Birth: [birth year] Referring Provider (PT): Dr. Lynne Leader   Encounter Date: 12/14/2020   PT End of Session - 12/14/20 1103     Visit Number 1    Number of Visits 20    Date for PT Re-Evaluation 02/22/21    Authorization Type Medicare, BCBS, KX at 15    Progress Note Due on Visit 10    PT Start Time 1104    PT Stop Time 1140    PT Time Calculation (min) 36 min    Activity Tolerance Patient tolerated treatment well    Behavior During Therapy [facility] for tasks assessed/performed             Past Medical History:  Diagnosis Date   Allergy    seasonal allergies   Cataract 2018   sx to correct-bilateral   Diabetes mellitus without complication ([place])    on meds   GERD (gastroesophageal reflux disease)    hx of   Hyperlipidemia    on meds   Hypertension    on meds    Past Surgical History:  Procedure Laterality Date   CATARACT EXTRACTION, BILATERAL Bilateral 2018   CERVICAL SPINE SURGERY  2003   2 disc replaced   COLONOSCOPY  2011   Gessner-moviprep-exc-F/V=HPP   [place] Bilateral    TONSILLECTOMY     by natural causes- "they rotted out when I was younger"   WISDOM TOOTH EXTRACTION      There were no vitals filed for this visit.    Subjective Assessment - 12/14/20 1102     Subjective Pt. indicated complaints come and go.  Most complaints c prolonged sitting/standing.  Pt. indicated history of complaints of Rt side mainly but now the Lt hip is more noted (in last few weeks).  Pt. indicated sleeping troubles with waking due to symptoms but it varies as well.  Has complaints of Lt anterior hip and back and back of Lt hip and thigh.  Occasional tingling in anterior Lt thigh at times.     Limitations Sitting;Standing    How long can you sit comfortably? 30 mins    How long can you stand comfortably? varies 2 mins to 15 mins    Patient Stated Goals Reduce pain    Currently in Pain? Yes    Pain Score 5    pain at worst 8/10   Pain Location Back    Pain Orientation Left;Right    Pain Descriptors / Indicators Stabbing    Pain Type Chronic pain    Pain Radiating Towards Rt leg    Pain Onset More than a month ago    Pain Frequency Intermittent    Aggravating Factors  prolonged sitting, standing, sleeping difficulty    Pain Relieving Factors Prednisone helped, walking at times    Effect of Pain on Daily Activities Cooking, standing/sitting activity.                [facility] PT Assessment - 12/14/20 0001       Assessment   Medical Diagnosis Low back pain, Sciatica Rt side    Referring Provider (PT) Dr. Lynne Leader    Onset Date/Surgical Date 07/14/20   4-5 months ago   Hand Dominance Right  Precautions   Precautions None      Restrictions   Weight Bearing Restrictions No      Balance Screen   Has the patient fallen in the past 6 months No    Has the patient had a decrease in activity level because of a fear of falling?  No    Is the patient reluctant to leave their home because of a fear of falling?  No      Home Environment   Living Environment Private residence    Additional Comments stairs in house (no trouble)      Prior Function   Level of Independence Independent    Vocation Requirements Work requires sitting primarily.    Leisure Golf, walking for exercise at times      Cognition   Overall Cognitive Status Within Functional Limits for tasks assessed      Observation/Other Assessments   Focus on Therapeutic Outcomes (FOTO)  intake 52%, predicted 67%      Sensation   Light Touch Appears Intact      Posture/Postural Control   Posture Comments Reduced lumbar lordosis, even iliac crest      ROM / Strength   AROM / PROM / Strength  Strength;PROM;AROM      AROM   Overall AROM Comments General reduced lumbar mobility with most movement coming from thoracolumbar region    AROM Assessment Site Lumbar;Hip    Lumbar Flexion movement to ankles no worsening    Lumbar Extension 75% c reduced lumbar mobility noted, no production of symptoms    Lumbar - Right Side Bend Movement to lateral epicondyle c tightness in lumbar    Lumbar - Left Side Bend Movement to lateral epicondyle c tightness in lumbar      Strength   Overall Strength Comments Lumbar flexion 3+/5    Strength Assessment Site Hip;Knee;Ankle    Right/Left Hip Right;Left    Right Hip Flexion 5/5    Left Hip Flexion 5/5    Right/Left Knee Left;Right    Right Knee Flexion 5/5    Right Knee Extension 5/5    Left Knee Flexion 5/5    Left Knee Extension 5/5    Right/Left Ankle Right;Left    Right Ankle Dorsiflexion 5/5    Left Ankle Dorsiflexion 5/5      Special Tests   Other special tests (-) slump bilateral, negative crossed SLR bilateral, + Thomas test on Lt for iliopsoas and rectus c concordant symptoms                        Objective measurements completed on examination: See above findings.       [facility] Adult PT Treatment/Exercise - 12/14/20 0001       Exercises   Exercises Lumbar;Other Exercises    Other Exercises  HEP instruction/performance c cues for techniques, handout provided.  Trial set performed of each for comprehension and symptom assessment.  HEP consisting of lumbar extension standing, supine lumbar trunk rotation 15 sec holds, supine thomas test hip flexor stretch bilateral 15 sec, supine bridge 2 sec holds                    PT Education - 12/14/20 1135     Education Details HEP, POC    Person(s) Educated Patient    Methods Explanation;Demonstration;Verbal cues;Handout    Comprehension Returned demonstration;Verbalized understanding              PT Short Term  Goals - 12/14/20 1103       PT SHORT  TERM GOAL #1   Title Patient will demonstrate independent use of home exercise program to maintain progress from in clinic treatments.    Time 3    Period Weeks    Status New    Target Date 01/04/21               PT Long Term Goals - 12/14/20 1103       PT LONG TERM GOAL #1   Title Patient will demonstrate/report pain at worst less than or equal to 2/10 to facilitate minimal limitation in daily activity secondary to pain symptoms.    Time 10    Period Weeks    Status New    Target Date 02/22/21      PT LONG TERM GOAL #2   Title Patient will demonstrate independent use of home exercise program to facilitate ability to maintain/progress functional gains from skilled physical therapy services.    Time 10    Period Weeks    Status New    Target Date 02/22/21      PT LONG TERM GOAL #3   Title Pt. will demonstrate FOTO outcome > or = 67 % to indicated reduced disability due to condition.    Time 10    Period Weeks    Status New    Target Date 02/22/21      PT LONG TERM GOAL #4   Title Pt. will demonstrate lumbar ext > or = 100% to facilitate usual standing, walking at PLOF.    Time 10    Period Weeks    Status New    Target Date 02/22/21      PT LONG TERM GOAL #5   Title Pt. will report ability to sit and stand unrestricted for work at [employer].    Time 10    Period Weeks    Status New    Target Date 02/22/21                    Plan - 12/14/20 1104     Clinical Impression Statement Patient is a 80 y.o. who comes to clinic with complaints of low back pain c history of Lt and Rt hip/leg pain with mobility, strength and movement coordination deficits that impair their ability to perform usual daily and recreational functional activities without increase difficulty/symptoms at this time.  Patient to benefit from skilled PT services to address impairments and limitations to improve to previous level of function without restriction secondary to condition.     Personal Factors and Comorbidities Comorbidity 3+    Comorbidities DM, GERD, hyperlipidemia, HTN    Examination-Activity Limitations Sleep;Sit;Bend;Stand    Examination-Participation Restrictions Occupation;Driving;Community Activity    Stability/Clinical Decision Making Stable/Uncomplicated    Clinical Decision Making Low    Rehab Potential Good    PT Frequency 2x / week   up to 2x/week   PT Duration Other (comment)   10 weeks   PT Treatment/Interventions ADLs/Self Care Home Management;Cryotherapy;Electrical Stimulation;Iontophoresis '4mg'$ /ml Dexamethasone;Moist Heat;Traction;Balance training;Therapeutic exercise;Therapeutic activities;Functional mobility training;Stair training;Gait training;DME Instruction;Ultrasound;Neuromuscular re-education;Patient/family education;Passive range of motion;Spinal Manipulations;Joint Manipulations;Dry needling;Taping;Manual techniques    PT Next Visit Plan Possible PAIVM intervention, promote improved lumbar/hip mobility    PT Home Exercise Plan [email or web address]    Consulted and Agree with Plan of Care Patient             Patient will benefit from skilled therapeutic intervention in order to improve  the following deficits and impairments:  Hypomobility, Decreased activity tolerance, Decreased strength, Pain, Increased fascial restricitons, Decreased mobility, Improper body mechanics, Impaired perceived functional ability, Impaired flexibility, Postural dysfunction, Decreased range of motion, Decreased coordination  Visit Diagnosis: Chronic bilateral low back pain with bilateral sciatica  Muscle weakness (generalized)  Difficulty in walking, not elsewhere classified  Pain in left leg  Pain in right leg  Abnormal posture     Problem List Patient Active Problem List   Diagnosis Date Noted   Polycythemia, secondary 03/14/2013   Scot Jun, PT, DPT, OCS, ATC 12/14/20  12:37 PM      [facility] [address] [place], [place], [address] Phone: [phone]   Fax:  [phone]  Name: Pawan Dame MRN: [id number] Date of Birth: 05/08/1941

## 2020-12-14 NOTE — Patient Instructions (Signed)
Access Code: [email or web address] URL: [email or web address] Date: 12/14/2020 Prepared by: Scot Jun  Exercises Supine Lower Trunk Rotation - 2 x daily - 7 x weekly - 1 sets - 5 reps - 15 hold Supine Bridge - 2 x daily - 7 x weekly - 3 sets - 10 reps - 2 hold Standing Lumbar Extension - 2 x daily - 7 x weekly - 2-3 sets - 10 reps Hip Flexor Stretch at Edge of Bed - 2 x daily - 7 x weekly - 1 sets - 5 reps - 30 hold

## 2020-12-28 ENCOUNTER — Other Ambulatory Visit: Payer: Self-pay

## 2020-12-28 ENCOUNTER — Ambulatory Visit
Admission: RE | Admit: 2020-12-28 | Discharge: 2020-12-28 | Disposition: A | Discharge status: Home / Self Care | Payer: No Typology Code available for payment source | Source: Ambulatory Visit | Attending: Internal Medicine | Admitting: Internal Medicine

## 2020-12-28 ENCOUNTER — Encounter: Payer: Self-pay | Admitting: Family

## 2020-12-28 DIAGNOSIS — E118 Type 2 diabetes mellitus with unspecified complications: Secondary | ICD-10-CM

## 2020-12-30 ENCOUNTER — Other Ambulatory Visit: Payer: Self-pay

## 2020-12-30 ENCOUNTER — Encounter: Payer: Self-pay | Admitting: Rehabilitative and Restorative Service Providers"

## 2020-12-30 ENCOUNTER — Ambulatory Visit (INDEPENDENT_AMBULATORY_CARE_PROVIDER_SITE_OTHER): Payer: Medicare Other | Admitting: Rehabilitative and Restorative Service Providers"

## 2020-12-30 DIAGNOSIS — M6281 Muscle weakness (generalized): Secondary | ICD-10-CM | POA: Diagnosis not present

## 2020-12-30 DIAGNOSIS — G8929 Other chronic pain: Secondary | ICD-10-CM | POA: Diagnosis not present

## 2020-12-30 DIAGNOSIS — M79604 Pain in right leg: Secondary | ICD-10-CM

## 2020-12-30 DIAGNOSIS — R293 Abnormal posture: Secondary | ICD-10-CM | POA: Diagnosis not present

## 2020-12-30 DIAGNOSIS — I2510 Atherosclerotic heart disease of native coronary artery without angina pectoris: Secondary | ICD-10-CM | POA: Diagnosis not present

## 2020-12-30 DIAGNOSIS — M5442 Lumbago with sciatica, left side: Secondary | ICD-10-CM

## 2020-12-30 DIAGNOSIS — I10 Essential (primary) hypertension: Secondary | ICD-10-CM | POA: Diagnosis not present

## 2020-12-30 DIAGNOSIS — Z7982 Long term (current) use of aspirin: Secondary | ICD-10-CM | POA: Diagnosis not present

## 2020-12-30 DIAGNOSIS — M79605 Pain in left leg: Secondary | ICD-10-CM

## 2020-12-30 DIAGNOSIS — R262 Difficulty in walking, not elsewhere classified: Secondary | ICD-10-CM

## 2020-12-30 DIAGNOSIS — M5441 Lumbago with sciatica, right side: Secondary | ICD-10-CM

## 2020-12-30 DIAGNOSIS — E118 Type 2 diabetes mellitus with unspecified complications: Secondary | ICD-10-CM | POA: Diagnosis not present

## 2020-12-30 NOTE — Therapy (Signed)
[facility] [address] [place], [place], [address] Phone: [phone]   Fax:  [phone]  Physical Therapy Treatment  Patient Details  Name: Gary Carpenter MRN: [id number] Date of Birth: December 24, 1940 Referring Provider (PT): Dr. Lynne Leader   Encounter Date: 12/30/2020   PT End of Session - 12/30/20 1505     Visit Number 2    Number of Visits 20    Date for PT Re-Evaluation 02/22/21    Authorization Type Medicare, BCBS, KX at 15    Progress Note Due on Visit 10    PT Start Time 1505    PT Stop Time 1530    PT Time Calculation (min) 25 min    Activity Tolerance Patient tolerated treatment well    Behavior During Therapy [facility] for tasks assessed/performed             Past Medical History:  Diagnosis Date   Allergy    seasonal allergies   Cataract 2018   sx to correct-bilateral   Diabetes mellitus without complication ([place])    on meds   GERD (gastroesophageal reflux disease)    hx of   Hyperlipidemia    on meds   Hypertension    on meds    Past Surgical History:  Procedure Laterality Date   CATARACT EXTRACTION, BILATERAL Bilateral 2018   CERVICAL SPINE SURGERY  2003   2 disc replaced   COLONOSCOPY  2011   Gessner-moviprep-exc-F/V=HPP   [place] Bilateral    TONSILLECTOMY     by natural causes- "they rotted out when I was younger"   WISDOM TOOTH EXTRACTION      There were no vitals filed for this visit.   Subjective Assessment - 12/30/20 1511     Subjective Pt. stated overall better.  No pain upon arrival.  Pt. indicated he has been able to play golf without complaints.    Limitations Sitting;Standing    How long can you sit comfortably? 30 mins    How long can you stand comfortably? varies 2 mins to 15 mins    Patient Stated Goals Reduce pain    Currently in Pain? No/denies    Pain Score 0-No pain    Pain Onset More than a month ago                 [facility] PT Assessment - 12/30/20 0001       Assessment   Medical Diagnosis Low back pain, Sciatica Rt side    Referring Provider (PT) Dr. Lynne Leader    Onset Date/Surgical Date 07/14/20    Hand Dominance Right      AROM   Lumbar Extension 100% no complaint    Lumbar - Right Side Bend Movement to lateral epicondyle    Lumbar - Left Side Bend Movement to lateral epicondyle                           [facility] Adult PT Treatment/Exercise - 12/30/20 0001       Exercises   Other Exercises  Reviewed existing HEP c cues given for adjustment of techinques on a few exerciess to improve for home use.      Lumbar Exercises: Stretches   Other Lumbar Stretch Exercise supine LTR stretch 10 sec x 1 bilateral, supine thomas stretch 15 sec x 2 bilateral, supine bridge x 5  Lumbar Exercises: Standing   Other Standing Lumbar Exercises lumbar ext x 5                      PT Short Term Goals - 12/30/20 1534       PT SHORT TERM GOAL #1   Title Patient will demonstrate independent use of home exercise program to maintain progress from in clinic treatments.    Time 3    Period Weeks    Status On-going    Target Date 01/04/21               PT Long Term Goals - 12/14/20 1103       PT LONG TERM GOAL #1   Title Patient will demonstrate/report pain at worst less than or equal to 2/10 to facilitate minimal limitation in daily activity secondary to pain symptoms.    Time 10    Period Weeks    Status New    Target Date 02/22/21      PT LONG TERM GOAL #2   Title Patient will demonstrate independent use of home exercise program to facilitate ability to maintain/progress functional gains from skilled physical therapy services.    Time 10    Period Weeks    Status New    Target Date 02/22/21      PT LONG TERM GOAL #3   Title Pt. will demonstrate FOTO outcome > or = 67 % to indicated reduced disability due to condition.    Time 10    Period Weeks     Status New    Target Date 02/22/21      PT LONG TERM GOAL #4   Title Pt. will demonstrate lumbar ext > or = 100% to facilitate usual standing, walking at PLOF.    Time 10    Period Weeks    Status New    Target Date 02/22/21      PT LONG TERM GOAL #5   Title Pt. will report ability to sit and stand unrestricted for work at [employer].    Time 10    Period Weeks    Status New    Target Date 02/22/21                   Plan - 12/30/20 1534     Clinical Impression Statement Fair to good knowledge of HEP c cues required at times to improve positioning.  Assessment of movement indicated improvement in movement quantity and progression of symptoms and return to activity without limitations.  Plan to follow up in 3 weeks for reassessment of presentation with possible d/c at that time.    Personal Factors and Comorbidities Comorbidity 3+    Comorbidities DM, GERD, hyperlipidemia, HTN    Examination-Activity Limitations Sleep;Sit;Bend;Stand    Examination-Participation Restrictions Occupation;Driving;Community Activity    Stability/Clinical Decision Making Stable/Uncomplicated    Rehab Potential Good    PT Frequency 2x / week   up to 2x/week   PT Duration Other (comment)   10 weeks   PT Treatment/Interventions ADLs/Self Care Home Management;Cryotherapy;Electrical Stimulation;Iontophoresis '4mg'$ /ml Dexamethasone;Moist Heat;Traction;Balance training;Therapeutic exercise;Therapeutic activities;Functional mobility training;Stair training;Gait training;DME Instruction;Ultrasound;Neuromuscular re-education;Patient/family education;Passive range of motion;Spinal Manipulations;Joint Manipulations;Dry needling;Taping;Manual techniques    PT Next Visit Plan Possible d/c pending response over next 3 weeks.    PT Home Exercise Plan [email or web address]    Consulted and Agree with Plan of Care Patient             Patient will benefit from  skilled therapeutic intervention in order to improve the following  deficits and impairments:  Hypomobility, Decreased activity tolerance, Decreased strength, Pain, Increased fascial restricitons, Decreased mobility, Improper body mechanics, Impaired perceived functional ability, Impaired flexibility, Postural dysfunction, Decreased range of motion, Decreased coordination  Visit Diagnosis: Chronic bilateral low back pain with bilateral sciatica  Muscle weakness (generalized)  Difficulty in walking, not elsewhere classified  Pain in left leg  Pain in right leg  Abnormal posture     Problem List Patient Active Problem List   Diagnosis Date Noted   Polycythemia, secondary 03/14/2013   Scot Jun, PT, DPT, OCS, ATC 12/30/20  3:38 PM    [facility] [address] [place], [place], [address] Phone: [phone]   Fax:  [phone]  Name: Purl Blount MRN: [id number] Date of Birth: 1941-02-04

## 2021-01-04 ENCOUNTER — Encounter: Payer: Medicare Other | Admitting: Rehabilitative and Restorative Service Providers"

## 2021-01-05 NOTE — Progress Notes (Signed)
I, Peterson Lombard, LAT, ATC acting as a scribe for Lynne Leader, MD.  Gary Carpenter is a 80 y.o. male who presents to [facility] today for R-sided sciatica. Pt was last seen by Dr. Georgina Snell on 11/25/20 and was prescribed prednisone and referred to PT, of which he's completed 2 visits. Today, pt reports he is feeling a lot better. Pt only has pain along the lateral aspect of his L lower leg. No numbness/tingling noted. Pt also c/o a "tightness" in his L knee.   Dx imaging: 11/25/20 L-spine XR  Pertinent review of systems: No fevers or chills  Relevant historical information: Polycythemia   Exam:  BP (!) 158/88   Pulse (!) 103   Ht '6\' 2"'$  (1.88 m)   Wt 210 lb 6.4 oz (95.4 kg)   SpO2 95%   BMI 27.01 kg/m  General: Well Developed, well nourished, and in no acute distress.   MSK: L-spine nontender normal lumbar motion.  Mildly positive right-sided slump test. Right knee normal.  Normal motion nontender normal strength stable ligamentous exam.    Lab and Radiology Results  X-ray images right knee obtained today personally and independently interpreted Moderate medial and patellofemoral DJD.  No acute fractures Await formal radiology review  EXAM: LUMBAR SPINE - 2-3 VIEW   COMPARISON:  None.   FINDINGS: Five non-rib-bearing lumbar vertebra. Minimal broad-based rightward scoliotic curvature. No listhesis. Vertebral body heights are normal. Disc space narrowing and endplate spurring at 075-GRM, L2-L3, and L4-L5. L4-L5 and L5-S1 facet hypertrophy. No fracture, evidence of focal bone lesion or bone destruction. Moderate aorto bi-iliac atherosclerosis. Sacroiliac joints are congruent   IMPRESSION: 1. Multilevel degenerative disc disease, most prominently affecting L2-L3. 2. Lower lumbar facet hypertrophy. 3. Moderate aorto bi-iliac atherosclerosis.     Electronically Signed   By: Keith Rake M.D.   On: 11/28/2020 16:00 I, Lynne Leader,  personally (independently) visualized and performed the interpretation of the images attached in this note.    Assessment and Plan: 80 y.o. male with right lateral proximal calf pain.  This certainly is most likely to be L5 lumbar radiculopathy but could be related to knee issue as it tends to occur with a loaded bent knee such as standing from a seated position.  Unable to reproduce the pain with knee physical exam maneuvers today.   Overall he is pretty happy with how things are going.  Plan for Voltaren gel and a bit of watchful waiting.  Next step could be steroid injection into the knee, referral back to physical therapy, or even MRI of the L-spine.  He does not want to do any of these things right now so watchful waiting it is.  He will let me know if things worsen.   PDMP not reviewed this encounter. Orders Placed This Encounter  Procedures   DG Knee AP/LAT W/Sunrise Right    Standing Status:   Future    Number of Occurrences:   1    Standing Expiration Date:   01/06/2022    Order Specific Question:   Reason for Exam (SYMPTOM  OR DIAGNOSIS REQUIRED)    Answer:   eval knee    Order Specific Question:   Preferred imaging location?    Answer:   [facility]   No orders of the defined types were placed in this encounter.    Discussed warning signs or symptoms. Please see discharge instructions. Patient expresses understanding.   The above documentation has been  reviewed and is accurate and complete Lynne Leader, M.D.

## 2021-01-06 ENCOUNTER — Ambulatory Visit (INDEPENDENT_AMBULATORY_CARE_PROVIDER_SITE_OTHER): Payer: Medicare Other | Admitting: Family Medicine

## 2021-01-06 ENCOUNTER — Other Ambulatory Visit: Payer: Self-pay

## 2021-01-06 ENCOUNTER — Ambulatory Visit (INDEPENDENT_AMBULATORY_CARE_PROVIDER_SITE_OTHER): Payer: Medicare Other

## 2021-01-06 VITALS — BP 158/88 | HR 103 | Ht 74.0 in | Wt 210.4 lb

## 2021-01-06 DIAGNOSIS — M25561 Pain in right knee: Secondary | ICD-10-CM | POA: Diagnosis not present

## 2021-01-06 DIAGNOSIS — M25461 Effusion, right knee: Secondary | ICD-10-CM | POA: Diagnosis not present

## 2021-01-06 NOTE — Patient Instructions (Addendum)
Thank you for coming in today.   Please use Voltaren gel (Generic Diclofenac Gel) up to 4x daily for pain as needed.  This is available over-the-counter as both the name brand Voltaren gel and the generic diclofenac gel.   Please get an Xray today before you leave   Next step would be knee injection or MRI of your back for injection planning in your back.   You could also go back to PT.   Let me know if you want the referral to PT.

## 2021-01-07 NOTE — Progress Notes (Signed)
Right knee x-ray shows mild to medium arthritis present.

## 2021-01-11 ENCOUNTER — Encounter: Payer: Medicare Other | Admitting: Rehabilitative and Restorative Service Providers"

## 2021-01-20 ENCOUNTER — Ambulatory Visit (INDEPENDENT_AMBULATORY_CARE_PROVIDER_SITE_OTHER): Payer: Medicare Other | Admitting: Rehabilitative and Restorative Service Providers"

## 2021-01-20 ENCOUNTER — Encounter: Payer: Self-pay | Admitting: Rehabilitative and Restorative Service Providers"

## 2021-01-20 ENCOUNTER — Other Ambulatory Visit: Payer: Self-pay

## 2021-01-20 DIAGNOSIS — M5441 Lumbago with sciatica, right side: Secondary | ICD-10-CM

## 2021-01-20 DIAGNOSIS — M6281 Muscle weakness (generalized): Secondary | ICD-10-CM | POA: Diagnosis not present

## 2021-01-20 DIAGNOSIS — R262 Difficulty in walking, not elsewhere classified: Secondary | ICD-10-CM | POA: Diagnosis not present

## 2021-01-20 DIAGNOSIS — M79604 Pain in right leg: Secondary | ICD-10-CM | POA: Diagnosis not present

## 2021-01-20 DIAGNOSIS — R293 Abnormal posture: Secondary | ICD-10-CM | POA: Diagnosis not present

## 2021-01-20 DIAGNOSIS — M79605 Pain in left leg: Secondary | ICD-10-CM

## 2021-01-20 DIAGNOSIS — G8929 Other chronic pain: Secondary | ICD-10-CM | POA: Diagnosis not present

## 2021-01-20 DIAGNOSIS — M5442 Lumbago with sciatica, left side: Secondary | ICD-10-CM

## 2021-01-20 NOTE — Therapy (Signed)
[facility] Physical Therapy [address] [place], [place], [address] Phone: [phone]   Fax:  [phone]  Physical Therapy Treatment/Discharge  Patient Details  Name: Gary Carpenter MRN: [id number] Date of Birth: 1941-05-08 Referring Provider (PT): Dr. Lynne Leader   Encounter Date: 01/20/2021  PHYSICAL THERAPY DISCHARGE SUMMARY  Visits from Start of Care: 3  Current functional level related to goals / functional outcomes: See note   Remaining deficits: See note   Education / Equipment: HEP   Patient agrees to discharge. Patient goals were met. Patient is being discharged due to being pleased with the current functional level.    PT End of Session - 01/20/21 1112     Visit Number 3    Number of Visits 20    Date for PT Re-Evaluation 02/22/21    Authorization Type Medicare, BCBS, KX at 15    Progress Note Due on Visit 10    PT Start Time 1059    PT Stop Time 1109    PT Time Calculation (min) 10 min    Activity Tolerance Patient tolerated treatment well    Behavior During Therapy WFL for tasks assessed/performed             Past Medical History:  Diagnosis Date   Allergy    seasonal allergies   Cataract 2018   sx to correct-bilateral   Diabetes mellitus without complication ([place])    on meds   GERD (gastroesophageal reflux disease)    hx of   Hyperlipidemia    on meds   Hypertension    on meds    Past Surgical History:  Procedure Laterality Date   CATARACT EXTRACTION, BILATERAL Bilateral 2018   CERVICAL SPINE SURGERY  2003   2 disc replaced   COLONOSCOPY  2011   Gessner-moviprep-exc-F/V=HPP   [place] Bilateral    TONSILLECTOMY     by natural causes- "they rotted out when I was younger"   WISDOM TOOTH EXTRACTION      There were no vitals filed for this visit.   Subjective Assessment - 01/20/21 1101     Subjective Pt. indicated he does continue to have  some pain in lateral calf and some numb feeling at times but still doing much better.    Limitations Sitting;Standing    How long can you sit comfortably? 30 mins    How long can you stand comfortably? varies 2 mins to 15 mins    Patient Stated Goals Reduce pain    Currently in Pain? No/denies    Pain Onset More than a month ago                [facility] PT Assessment - 01/20/21 0001       Assessment   Medical Diagnosis Low back pain, Sciatica Rt side    Referring Provider (PT) Dr. Lynne Leader    Onset Date/Surgical Date 07/14/20      Observation/Other Assessments   Focus on Therapeutic Outcomes (FOTO)  updated 77%                           [facility] Adult PT Treatment/Exercise - 01/20/21 0001       Exercises   Other Exercises  Review of HEP      Lumbar Exercises: Stretches   Other Lumbar Stretch Exercise incilne calf stretch 30 sec x 5 bilateral  PT Education - 01/20/21 1110     Education Details HEP review for d/c.    Person(s) Educated Patient    Methods Explanation;Demonstration;Verbal cues    Comprehension Returned demonstration;Verbalized understanding              PT Short Term Goals - 01/20/21 1111       PT SHORT TERM GOAL #1   Title Patient will demonstrate independent use of home exercise program to maintain progress from in clinic treatments.    Time 3    Period Weeks    Status Achieved    Target Date 01/04/21               PT Long Term Goals - 01/20/21 1111       PT LONG TERM GOAL #1   Title Patient will demonstrate/report pain at worst less than or equal to 2/10 to facilitate minimal limitation in daily activity secondary to pain symptoms.    Time 10    Period Weeks    Status Achieved      PT LONG TERM GOAL #2   Title Patient will demonstrate independent use of home exercise program to facilitate ability to maintain/progress functional gains from skilled physical therapy services.    Time 10     Period Weeks    Status Achieved      PT LONG TERM GOAL #3   Title Pt. will demonstrate FOTO outcome > or = 67 % to indicated reduced disability due to condition.    Time 10    Period Weeks    Status Achieved      PT LONG TERM GOAL #4   Title Pt. will demonstrate lumbar ext > or = 100% to facilitate usual standing, walking at PLOF.    Time 10    Period Weeks    Status Achieved      PT LONG TERM GOAL #5   Title Pt. will report ability to sit and stand unrestricted for work at [employer].    Time 10    Period Weeks    Status Achieved                   Plan - 01/20/21 1111     Clinical Impression Statement Pt. has reached established goals and reported confidence and desire to transition to HEP at this time.  Pt. was appropriate and d/c performed today.    Personal Factors and Comorbidities Comorbidity 3+    Comorbidities DM, GERD, hyperlipidemia, HTN    Examination-Activity Limitations Sleep;Sit;Bend;Stand    Examination-Participation Restrictions Occupation;Driving;Community Activity    Stability/Clinical Decision Making Stable/Uncomplicated    Rehab Potential Good    PT Frequency --   up to 2x/week   PT Duration --   10 weeks   PT Treatment/Interventions ADLs/Self Care Home Management;Cryotherapy;Electrical Stimulation;Iontophoresis 5m/ml Dexamethasone;Moist Heat;Traction;Balance training;Therapeutic exercise;Therapeutic activities;Functional mobility training;Stair training;Gait training;DME Instruction;Ultrasound;Neuromuscular re-education;Patient/family education;Passive range of motion;Spinal Manipulations;Joint Manipulations;Dry needling;Taping;Manual techniques    PT Next Visit Plan D/C to HEP    PT Home Exercise Plan [id number]    Consulted and Agree with Plan of Care Patient             Patient will benefit from skilled therapeutic intervention in order to improve the following deficits and impairments:  Hypomobility, Decreased activity tolerance, Decreased  strength, Pain, Increased fascial restricitons, Decreased mobility, Improper body mechanics, Impaired perceived functional ability, Impaired flexibility, Postural dysfunction, Decreased range of motion, Decreased coordination  Visit Diagnosis: Chronic bilateral low back pain  with bilateral sciatica  Muscle weakness (generalized)  Difficulty in walking, not elsewhere classified  Pain in left leg  Pain in right leg  Abnormal posture     Problem List Patient Active Problem List   Diagnosis Date Noted   Polycythemia, secondary 03/14/2013   Scot Jun, PT, DPT, OCS, ATC 01/20/21  11:13 AM    [facility] Physical Therapy [address] [place], [place], [address] Phone: [phone]   Fax:  [phone]  Name: Axle Parfait MRN: [id number] Date of Birth: 1940-11-08

## 2021-01-25 ENCOUNTER — Encounter: Payer: Medicare Other | Admitting: Rehabilitative and Restorative Service Providers"

## 2021-02-01 ENCOUNTER — Encounter: Payer: Medicare Other | Admitting: Rehabilitative and Restorative Service Providers"

## 2021-04-07 DIAGNOSIS — Z23 Encounter for immunization: Secondary | ICD-10-CM | POA: Diagnosis not present

## 2021-04-07 DIAGNOSIS — I2510 Atherosclerotic heart disease of native coronary artery without angina pectoris: Secondary | ICD-10-CM | POA: Diagnosis not present

## 2021-04-07 DIAGNOSIS — T466X5A Adverse effect of antihyperlipidemic and antiarteriosclerotic drugs, initial encounter: Secondary | ICD-10-CM | POA: Diagnosis not present

## 2021-04-07 DIAGNOSIS — I10 Essential (primary) hypertension: Secondary | ICD-10-CM | POA: Diagnosis not present

## 2021-04-07 DIAGNOSIS — M791 Myalgia, unspecified site: Secondary | ICD-10-CM | POA: Diagnosis not present

## 2021-04-07 DIAGNOSIS — E119 Type 2 diabetes mellitus without complications: Secondary | ICD-10-CM | POA: Diagnosis not present

## 2021-04-26 ENCOUNTER — Other Ambulatory Visit: Payer: Self-pay

## 2021-04-26 ENCOUNTER — Encounter: Payer: Self-pay | Admitting: Hematology & Oncology

## 2021-04-26 ENCOUNTER — Inpatient Hospital Stay: Payer: Medicare Other

## 2021-04-26 ENCOUNTER — Inpatient Hospital Stay: Payer: Medicare Other | Attending: Hematology & Oncology

## 2021-04-26 ENCOUNTER — Inpatient Hospital Stay (HOSPITAL_BASED_OUTPATIENT_CLINIC_OR_DEPARTMENT_OTHER): Payer: Medicare Other | Admitting: Hematology & Oncology

## 2021-04-26 VITALS — BP 159/90 | HR 87 | Temp 98.0°F | Resp 18 | Wt 204.1 lb

## 2021-04-26 DIAGNOSIS — D751 Secondary polycythemia: Secondary | ICD-10-CM

## 2021-04-26 DIAGNOSIS — Z7982 Long term (current) use of aspirin: Secondary | ICD-10-CM | POA: Insufficient documentation

## 2021-04-26 DIAGNOSIS — D508 Other iron deficiency anemias: Secondary | ICD-10-CM

## 2021-04-26 DIAGNOSIS — Z79899 Other long term (current) drug therapy: Secondary | ICD-10-CM | POA: Diagnosis not present

## 2021-04-26 DIAGNOSIS — E611 Iron deficiency: Secondary | ICD-10-CM | POA: Diagnosis not present

## 2021-04-26 LAB — CBC WITH DIFFERENTIAL (CANCER CENTER ONLY)
Abs Immature Granulocytes: 0.08 10*3/uL — ABNORMAL HIGH (ref 0.00–0.07)
Basophils Absolute: 0 10*3/uL (ref 0.0–0.1)
Basophils Relative: 1 %
Eosinophils Absolute: 0.2 10*3/uL (ref 0.0–0.5)
Eosinophils Relative: 3 %
HCT: 48.9 % (ref 39.0–52.0)
Hemoglobin: 17.4 g/dL — ABNORMAL HIGH (ref 13.0–17.0)
Immature Granulocytes: 1 %
Lymphocytes Relative: 25 %
Lymphs Abs: 1.9 10*3/uL (ref 0.7–4.0)
MCH: 32 pg (ref 26.0–34.0)
MCHC: 35.6 g/dL (ref 30.0–36.0)
MCV: 89.9 fL (ref 80.0–100.0)
Monocytes Absolute: 0.8 10*3/uL (ref 0.1–1.0)
Monocytes Relative: 10 %
Neutro Abs: 4.8 10*3/uL (ref 1.7–7.7)
Neutrophils Relative %: 60 %
Platelet Count: 250 10*3/uL (ref 150–400)
RBC: 5.44 MIL/uL (ref 4.22–5.81)
RDW: 12.3 % (ref 11.5–15.5)
WBC Count: 7.8 10*3/uL (ref 4.0–10.5)
nRBC: 0 % (ref 0.0–0.2)

## 2021-04-26 LAB — CMP (CANCER CENTER ONLY)
ALT: 17 U/L (ref 0–44)
AST: 16 U/L (ref 15–41)
Albumin: 4.5 g/dL (ref 3.5–5.0)
Alkaline Phosphatase: 67 U/L (ref 38–126)
Anion gap: 9 (ref 5–15)
BUN: 13 mg/dL (ref 8–23)
CO2: 32 mmol/L (ref 22–32)
Calcium: 10.9 mg/dL — ABNORMAL HIGH (ref 8.9–10.3)
Chloride: 97 mmol/L — ABNORMAL LOW (ref 98–111)
Creatinine: 1.02 mg/dL (ref 0.61–1.24)
GFR, Estimated: >60 mL/min (ref >60)
Glucose, Bld: 126 mg/dL — ABNORMAL HIGH (ref 70–99)
Potassium: 4.3 mmol/L (ref 3.5–5.1)
Sodium: 138 mmol/L (ref 135–145)
Total Bilirubin: 0.7 mg/dL (ref 0.3–1.2)
Total Protein: 7.1 g/dL (ref 6.5–8.1)

## 2021-04-26 LAB — IRON AND TIBC
Iron: 120 ug/dL (ref 42–163)
Saturation Ratios: 38 % (ref 20–55)
TIBC: 316 ug/dL (ref 202–409)
UIBC: 196 ug/dL (ref 117–376)

## 2021-04-26 LAB — FERRITIN: Ferritin: 93 ng/mL (ref 24–336)

## 2021-04-26 NOTE — Patient Instructions (Signed)

## 2021-04-26 NOTE — Progress Notes (Signed)
Hematology and Oncology Follow Up Visit  Endrit Gittins [id number] [birth year] 80 y.o. 04/26/2021   Principle Diagnosis:  Spurious polycythemia  Current Therapy:   Phlebotomy to maintain hematocrit less than 45% Aspirin 81 mg by mouth daily     Interim History:  Mr.  Weidinger is back for followup.  We last saw him 6 months ago.  As always, he is quite busy.  He just was down to the [place].  He had a wonderful time down there.  He had also had a very nice Thanksgiving with his family.  He has had no problems with headache.  He is on Ozempic now.  His blood sugars are getting better.  Sure his weight is also getting better.  He has had no problems with COVID.  There is no change in bowel or bladder habits.  Last iron studies back in June showed a ferritin of 43 with an iron saturation of 32%.  Overall, his performance status is ECOG 0.  Medical Medications:  Current Outpatient Medications:    aspirin 81 MG tablet, Take 81 mg by mouth daily.  , Disp: , Rfl:    fish oil-omega-3 fatty acids 1000 MG capsule, Take 1 g by mouth daily.  , Disp: , Rfl:    FREESTYLE LITE test strip, U TO CHECK ONCE A DAY UTD, Disp: , Rfl: 3   hydrochlorothiazide (HYDRODIURIL) 25 MG tablet, Take 25 mg by mouth every morning., Disp: , Rfl:    losartan (COZAAR) 100 MG tablet, Take 100 mg by mouth daily., Disp: , Rfl:    metFORMIN (GLUCOPHAGE-XR) 750 MG 24 hr tablet, TK 1 T PO QD WITH THE EVE MEAL, Disp: , Rfl: 4   OZEMPIC, 0.25 OR 0.5 MG/DOSE, 2 MG/1.5ML SOPN, Inject 1 mg into the skin once a week., Disp: , Rfl:    OZEMPIC, 1 MG/DOSE, 4 MG/3ML SOPN, Inject into the skin., Disp: , Rfl:    tadalafil (CIALIS) 10 MG tablet, Take 10 mg by mouth daily as needed., Disp: , Rfl:    predniSONE (DELTASONE) 20 MG tablet, Take 2 tablets (40 mg total) by mouth daily with breakfast. (Patient not taking: Reported on 01/06/2021), Disp: 10 tablet, Rfl: 0  Allergies:  Allergies  Allergen Reactions   Lidocaine     "made me  loopy"    Past Medical History, Surgical history, Social history, and Family History were reviewed and updated.  Review of Systems: Review of Systems  Constitutional: Negative.   HENT: Negative.    Eyes: Negative.   Respiratory: Negative.    Cardiovascular: Negative.   Gastrointestinal: Negative.   Genitourinary: Negative.   Musculoskeletal: Negative.   Skin: Negative.   Neurological: Negative.   Endo/Heme/Allergies: Negative.   Psychiatric/Behavioral: Negative.      Physical Exam:  weight is 204 lb 1.9 oz (92.6 kg). His oral temperature is 98 F (36.7 C). His blood pressure is 159/90 (abnormal) and his pulse is 87. His respiration is 18 and oxygen saturation is 98%.   Physical Exam Vitals reviewed.  HENT:     Head: Normocephalic and atraumatic.  Eyes:     Pupils: Pupils are equal, round, and reactive to light.  Cardiovascular:     Rate and Rhythm: Normal rate and regular rhythm.     Heart sounds: Normal heart sounds.  Pulmonary:     Effort: Pulmonary effort is normal.     Breath sounds: Normal breath sounds.  Abdominal:     General: Bowel sounds are normal.  Palpations: Abdomen is soft.  Musculoskeletal:        General: No tenderness or deformity. Normal range of motion.     Cervical back: Normal range of motion.  Lymphadenopathy:     Cervical: No cervical adenopathy.  Skin:    General: Skin is warm and dry.     Findings: No erythema or rash.  Neurological:     Mental Status: He is alert and oriented to person, place, and time.  Psychiatric:        Behavior: Behavior normal.        Thought Content: Thought content normal.        Judgment: Judgment normal.     Lab Results  Component Value Date   WBC 7.8 04/26/2021   HGB 17.4 (H) 04/26/2021   HCT 48.9 04/26/2021   MCV 89.9 04/26/2021   PLT 250 04/26/2021     Chemistry      Component Value Date/Time   NA 138 04/26/2021 1005   NA 143 03/29/2017 0856   NA 137 03/28/2016 0841   K 4.3 04/26/2021  1005   K 4.0 03/29/2017 0856   K 4.2 03/28/2016 0841   CL 97 (L) 04/26/2021 1005   CL 101 03/29/2017 0856   CO2 32 04/26/2021 1005   CO2 30 03/29/2017 0856   CO2 25 03/28/2016 0841   BUN 13 04/26/2021 1005   BUN 14 03/29/2017 0856   BUN 16.4 03/28/2016 0841   CREATININE 1.02 04/26/2021 1005   CREATININE 0.8 03/29/2017 0856   CREATININE 1.0 03/28/2016 0841      Component Value Date/Time   CALCIUM 10.9 (H) 04/26/2021 1005   CALCIUM 9.7 03/29/2017 0856   CALCIUM 9.7 03/28/2016 0841   ALKPHOS 67 04/26/2021 1005   ALKPHOS 86 (H) 03/29/2017 0856   ALKPHOS 78 03/28/2016 0841   AST 16 04/26/2021 1005   AST 23 03/28/2016 0841   ALT 17 04/26/2021 1005   ALT 41 03/29/2017 0856   ALT 34 03/28/2016 0841   BILITOT 0.7 04/26/2021 1005   BILITOT 1.14 03/28/2016 0841      Impression and Plan: Mr. Meyer is a 80 year old gentleman. He has spurious polycythemia. However, we are keeping his hematocrit below 45%. This does make him feel better.  I will go ahead and phlebotomize him today.    I am just so happy that he is still active.  He clearly does not even look close to his age.  I am giving him so much credit for how well to keep himself in shape.   12/12/202211:35 AM

## 2021-04-26 NOTE — Progress Notes (Signed)
VSS patient refused to wait the post 30 minutes after phlebotomy

## 2021-05-10 DIAGNOSIS — Z23 Encounter for immunization: Secondary | ICD-10-CM | POA: Diagnosis not present

## 2021-07-07 DIAGNOSIS — I2510 Atherosclerotic heart disease of native coronary artery without angina pectoris: Secondary | ICD-10-CM | POA: Diagnosis not present

## 2021-07-07 DIAGNOSIS — E119 Type 2 diabetes mellitus without complications: Secondary | ICD-10-CM | POA: Diagnosis not present

## 2021-07-07 DIAGNOSIS — M791 Myalgia, unspecified site: Secondary | ICD-10-CM | POA: Diagnosis not present

## 2021-07-07 DIAGNOSIS — I10 Essential (primary) hypertension: Secondary | ICD-10-CM | POA: Diagnosis not present

## 2021-07-14 DIAGNOSIS — I2510 Atherosclerotic heart disease of native coronary artery without angina pectoris: Secondary | ICD-10-CM | POA: Diagnosis not present

## 2021-07-14 DIAGNOSIS — M791 Myalgia, unspecified site: Secondary | ICD-10-CM | POA: Diagnosis not present

## 2021-07-14 DIAGNOSIS — I10 Essential (primary) hypertension: Secondary | ICD-10-CM | POA: Diagnosis not present

## 2021-07-14 DIAGNOSIS — E119 Type 2 diabetes mellitus without complications: Secondary | ICD-10-CM | POA: Diagnosis not present

## 2021-07-14 DIAGNOSIS — T466X5A Adverse effect of antihyperlipidemic and antiarteriosclerotic drugs, initial encounter: Secondary | ICD-10-CM | POA: Diagnosis not present

## 2021-09-08 DIAGNOSIS — D2261 Melanocytic nevi of right upper limb, including shoulder: Secondary | ICD-10-CM | POA: Diagnosis not present

## 2021-09-08 DIAGNOSIS — L820 Inflamed seborrheic keratosis: Secondary | ICD-10-CM | POA: Diagnosis not present

## 2021-09-08 DIAGNOSIS — D225 Melanocytic nevi of trunk: Secondary | ICD-10-CM | POA: Diagnosis not present

## 2021-09-08 DIAGNOSIS — L821 Other seborrheic keratosis: Secondary | ICD-10-CM | POA: Diagnosis not present

## 2021-09-08 DIAGNOSIS — L570 Actinic keratosis: Secondary | ICD-10-CM | POA: Diagnosis not present

## 2021-09-08 DIAGNOSIS — Z85828 Personal history of other malignant neoplasm of skin: Secondary | ICD-10-CM | POA: Diagnosis not present

## 2021-10-25 ENCOUNTER — Inpatient Hospital Stay: Payer: Medicare Other | Attending: Hematology & Oncology

## 2021-10-25 ENCOUNTER — Inpatient Hospital Stay (HOSPITAL_BASED_OUTPATIENT_CLINIC_OR_DEPARTMENT_OTHER): Payer: Medicare Other | Admitting: Hematology & Oncology

## 2021-10-25 ENCOUNTER — Encounter: Payer: Self-pay | Admitting: Hematology & Oncology

## 2021-10-25 ENCOUNTER — Inpatient Hospital Stay: Payer: Medicare Other

## 2021-10-25 ENCOUNTER — Other Ambulatory Visit: Payer: Self-pay | Admitting: Lab

## 2021-10-25 VITALS — BP 151/74 | HR 65 | Temp 97.8°F | Resp 20 | Ht 73.0 in | Wt 214.0 lb

## 2021-10-25 DIAGNOSIS — D751 Secondary polycythemia: Secondary | ICD-10-CM

## 2021-10-25 DIAGNOSIS — R799 Abnormal finding of blood chemistry, unspecified: Secondary | ICD-10-CM | POA: Diagnosis not present

## 2021-10-25 DIAGNOSIS — Z79899 Other long term (current) drug therapy: Secondary | ICD-10-CM | POA: Diagnosis not present

## 2021-10-25 LAB — CBC WITH DIFFERENTIAL (CANCER CENTER ONLY)
Abs Immature Granulocytes: 0.03 10*3/uL (ref 0.00–0.07)
Basophils Absolute: 0 10*3/uL (ref 0.0–0.1)
Basophils Relative: 1 %
Eosinophils Absolute: 0.2 10*3/uL (ref 0.0–0.5)
Eosinophils Relative: 3 %
HCT: 48 % (ref 39.0–52.0)
Hemoglobin: 17 g/dL (ref 13.0–17.0)
Immature Granulocytes: 0 %
Lymphocytes Relative: 25 %
Lymphs Abs: 1.7 10*3/uL (ref 0.7–4.0)
MCH: 31.6 pg (ref 26.0–34.0)
MCHC: 35.4 g/dL (ref 30.0–36.0)
MCV: 89.2 fL (ref 80.0–100.0)
Monocytes Absolute: 0.7 10*3/uL (ref 0.1–1.0)
Monocytes Relative: 11 %
Neutro Abs: 4.2 10*3/uL (ref 1.7–7.7)
Neutrophils Relative %: 60 %
Platelet Count: 186 10*3/uL (ref 150–400)
RBC: 5.38 MIL/uL (ref 4.22–5.81)
RDW: 13 % (ref 11.5–15.5)
WBC Count: 6.9 10*3/uL (ref 4.0–10.5)
nRBC: 0 % (ref 0.0–0.2)

## 2021-10-25 LAB — CMP (CANCER CENTER ONLY)
ALT: 18 U/L (ref 0–44)
AST: 17 U/L (ref 15–41)
Albumin: 4.5 g/dL (ref 3.5–5.0)
Alkaline Phosphatase: 57 U/L (ref 38–126)
Anion gap: 6 (ref 5–15)
BUN: 16 mg/dL (ref 8–23)
CO2: 30 mmol/L (ref 22–32)
Calcium: 10 mg/dL (ref 8.9–10.3)
Chloride: 100 mmol/L (ref 98–111)
Creatinine: 1.07 mg/dL (ref 0.61–1.24)
GFR, Estimated: >60 mL/min (ref >60)
Glucose, Bld: 118 mg/dL — ABNORMAL HIGH (ref 70–99)
Potassium: 4.1 mmol/L (ref 3.5–5.1)
Sodium: 136 mmol/L (ref 135–145)
Total Bilirubin: 0.7 mg/dL (ref 0.3–1.2)
Total Protein: 6.9 g/dL (ref 6.5–8.1)

## 2021-10-25 NOTE — Progress Notes (Signed)
Gary Carpenter presents today for phlebotomy per MD orders. After two different unsuccessful attempts made by myself and Thane Edu, RN, patient said that "two sticks are his max, and would like to reschedule". Advised to drink plenty of fluids before his next visit and he verbalized understanding. Message sent to scheduling to reschedule patient for upcoming Monday.

## 2021-10-25 NOTE — Progress Notes (Signed)
Hematology and Oncology Follow Up Visit  Dacen Frayre [id number] 1940/12/29 81 y.o. 10/25/2021   Principle Diagnosis:  Spurious polycythemia  Current Therapy:   Phlebotomy to maintain hematocrit less than 45% Aspirin 81 mg by mouth daily     Interim History:  Mr.  Rallis is back for followup.  We last saw him 6 months ago.  He is doing quite well.  He has had a very busy Spring.  Sounds like he will have a busy Summer.  He has been playing golf.  He has had no problems with cough or shortness of breath.  He is losing some weight.  I think he is still on the [place].  He has had no change in bowel or bladder habits.  He has had no problems with diarrhea.  When we last saw him back in December, his ferritin was 93 with an iron saturation of 38%.  He has had no issues with rashes.  Is been no leg swelling.  He has had no headache.  Overall, I would say his performance status is probably ECOG 0.     Medications:  Current Outpatient Medications:    aspirin 81 MG tablet, Take 81 mg by mouth daily.  , Disp: , Rfl:    fish oil-omega-3 fatty acids 1000 MG capsule, Take 1 g by mouth daily.  , Disp: , Rfl:    FREESTYLE LITE test strip, U TO CHECK ONCE A DAY UTD, Disp: , Rfl: 3   hydrochlorothiazide (HYDRODIURIL) 25 MG tablet, Take 25 mg by mouth every morning., Disp: , Rfl:    losartan (COZAAR) 100 MG tablet, Take 100 mg by mouth daily., Disp: , Rfl:    metFORMIN (GLUCOPHAGE-XR) 750 MG 24 hr tablet, TK 1 T PO QD WITH THE EVE MEAL, Disp: , Rfl: 4   OZEMPIC, 1 MG/DOSE, 4 MG/3ML SOPN, Inject into the skin., Disp: , Rfl:    tadalafil (CIALIS) 10 MG tablet, Take 10 mg by mouth daily as needed., Disp: , Rfl:   Allergies:  Allergies  Allergen Reactions   Lidocaine     "made me loopy"    Past Medical History, Surgical history, Social history, and Family History were reviewed and updated.  Review of Systems: Review of Systems  Constitutional: Negative.   HENT: Negative.    Eyes:  Negative.   Respiratory: Negative.    Cardiovascular: Negative.   Gastrointestinal: Negative.   Genitourinary: Negative.   Musculoskeletal: Negative.   Skin: Negative.   Neurological: Negative.   Endo/Heme/Allergies: Negative.   Psychiatric/Behavioral: Negative.       Physical Exam:  height is '6\' 1"'$  (1.854 m) and weight is 214 lb (97.1 kg). His oral temperature is 97.8 F (36.6 C). His blood pressure is 151/74 (abnormal) and his pulse is 65. His respiration is 20 and oxygen saturation is 99%.   Physical Exam Vitals reviewed.  HENT:     Head: Normocephalic and atraumatic.  Eyes:     Pupils: Pupils are equal, round, and reactive to light.  Cardiovascular:     Rate and Rhythm: Normal rate and regular rhythm.     Heart sounds: Normal heart sounds.  Pulmonary:     Effort: Pulmonary effort is normal.     Breath sounds: Normal breath sounds.  Abdominal:     General: Bowel sounds are normal.     Palpations: Abdomen is soft.  Musculoskeletal:        General: No tenderness or deformity. Normal range of motion.  Cervical back: Normal range of motion.  Lymphadenopathy:     Cervical: No cervical adenopathy.  Skin:    General: Skin is warm and dry.     Findings: No erythema or rash.  Neurological:     Mental Status: He is alert and oriented to person, place, and time.  Psychiatric:        Behavior: Behavior normal.        Thought Content: Thought content normal.        Judgment: Judgment normal.      Lab Results  Component Value Date   WBC 6.9 10/25/2021   HGB 17.0 10/25/2021   HCT 48.0 10/25/2021   MCV 89.2 10/25/2021   PLT 186 10/25/2021     Chemistry      Component Value Date/Time   NA 136 10/25/2021 1004   NA 143 03/29/2017 0856   NA 137 03/28/2016 0841   K 4.1 10/25/2021 1004   K 4.0 03/29/2017 0856   K 4.2 03/28/2016 0841   CL 100 10/25/2021 1004   CL 101 03/29/2017 0856   CO2 30 10/25/2021 1004   CO2 30 03/29/2017 0856   CO2 25 03/28/2016 0841   BUN  16 10/25/2021 1004   BUN 14 03/29/2017 0856   BUN 16.4 03/28/2016 0841   CREATININE 1.07 10/25/2021 1004   CREATININE 0.8 03/29/2017 0856   CREATININE 1.0 03/28/2016 0841      Component Value Date/Time   CALCIUM 10.0 10/25/2021 1004   CALCIUM 9.7 03/29/2017 0856   CALCIUM 9.7 03/28/2016 0841   ALKPHOS 57 10/25/2021 1004   ALKPHOS 86 (H) 03/29/2017 0856   ALKPHOS 78 03/28/2016 0841   AST 17 10/25/2021 1004   AST 23 03/28/2016 0841   ALT 18 10/25/2021 1004   ALT 41 03/29/2017 0856   ALT 34 03/28/2016 0841   BILITOT 0.7 10/25/2021 1004   BILITOT 1.14 03/28/2016 0841      Impression and Plan: Mr. Lips is a 81 year old gentleman. He has spurious polycythemia. However, we are keeping his hematocrit below 45%. This does make him feel better.  I will go ahead and phlebotomize him today.    I am just so happy that he is still active.  He clearly does not even look close to his age.  His birthday is [birth year].  I am sure that he will have a wonderful birthday.    I am giving him so much credit for how well to keep himself in shape.   6/12/202311:02 AM

## 2021-11-01 ENCOUNTER — Inpatient Hospital Stay: Payer: Medicare Other

## 2021-11-01 DIAGNOSIS — D751 Secondary polycythemia: Secondary | ICD-10-CM | POA: Diagnosis not present

## 2021-11-01 DIAGNOSIS — Z79899 Other long term (current) drug therapy: Secondary | ICD-10-CM | POA: Diagnosis not present

## 2021-11-01 NOTE — Patient Instructions (Signed)
Flebotoma teraputica Therapeutic Phlebotomy La flebotoma teraputica es la extraccin planificada de sangre del organismo de una persona para los fines de tratar Engineering geologist. El procedimiento es muy parecido a la donacin de [place]. Por lo general, se extrae aproximadamente una pinta (470 ml o 0.47 l) de sangre. El adulto promedio tiene entre 9 y 12 pintas (4.3 y 5.7 l) de sangre en el cuerpo. La flebotoma teraputica se puede usar para tratar las siguientes afecciones: Hemocromatosis. Una afeccin en la cual la sangre contiene una cantidad excesiva de hierro. Policitemia vera. Una afeccin en la cual la sangre contiene una cantidad excesiva de glbulos rojos. Porfiria cutnea tarda. Una enfermedad en la cual una parte importante de hemoglobina no se produce correctamente. Como consecuencia, se acumulan cantidades anormales de porfirinas en el organismo. Anemia drepanoctica. Una afeccin en la cual los glbulos rojos tienen una forma anormal: en lugar de ser redondos tienen forma de [place]. Informe al mdico acerca de lo siguiente: Cualquier alergia que tenga. Todos los [employer] [place], incluidos vitaminas, hierbas, gotas oftlmicas, cremas y medicamentos de venta libre. Cualquier problema de la sangre que tenga. Cirugas a las que se haya sometido. Cualquier afeccin mdica que tenga. Si est embarazada o podra estarlo. Cules son los riesgos? En general, se trata de un procedimiento seguro. Sin embargo, pueden ocurrir complicaciones, por ejemplo: Nuseas o sensacin de desvanecimiento. Presin arterial baja (hipotensin). Dolor, hemorragia, hinchazn o hematomas en el lugar de insercin de la aguja. Infeccin. Qu ocurre antes del procedimiento? Consulte al mdico si debe hacer o no lo siguiente: Quarry manager o suspender los medicamentos que [place] habitualmente. Esto es muy importante si toma medicamentos para la diabetes o anticoagulantes. Tomar medicamentos como aspirina e  ibuprofeno. Estos medicamentos pueden tener un efecto anticoagulante en la [place]. No tome estos medicamentos a menos que el mdico se lo indique. Usar medicamentos de venta libre, vitaminas, hierbas y suplementos. Use prendas que se puedan arremangar por encima del codo. Pueden extraerle Truddie Coco de [place]. Le medirn la presin arterial, frecuencia cardaca y frecuencia respiratoria. Qu ocurre durante el procedimiento?  Pueden administrarle un medicamento para adormecer la zona (anestesia local). Le harn un torniquete en el brazo. Se le pondr una aguja en una de las venas. Se adosarn un tubo y [place] de recoleccin a la aguja. La sangre circular a travs de la aguja y el tubo hasta la bolsa de Theatre stage manager. La bolsa de recoleccin se colocar ms abajo que su brazo para que la gravedad facilite el flujo de sangre hacia la bolsa. Tal vez le pidan que abra y cierre la mano lenta y continuamente durante todo el proceso de Theatre stage manager. Una vez que se haya extrado la cantidad especfica de sangre del organismo, se pinzarn la bolsa de Theatre stage manager y el tubo para cerrarlos. Se retirar la aguja de la vena. Se ejercer presin Dance movement psychotherapist donde se insert la aguja para Sales promotion account executive. Se colocar una venda (vendaje) sobre TEFL teacher de insercin de la aguja. El procedimiento puede variar segn el mdico y el hospital. Sander Nephew ocurre despus del procedimiento? Le medirn la presin arterial, frecuencia cardaca y frecuencia respiratoria despus del procedimiento. Se lo animar a beber lquidos. Le recomendarn que coma un bocadillo para evitar un nivel bajo de Dispensing optician. Evaluarn y controlarn su recuperacin. Retome sus actividades normales como se lo haya indicado el mdico. Resumen La flebotoma teraputica es la extraccin planificada de sangre del organismo de Gary Carpenter persona para los fines de  tratar Gary Carpenter afeccin. La flebotoma teraputica puede utilizarse para el  tratamiento de la hemocromatosis, la policitemia vera, la porfiria cutnea tarda o la anemia drepanoctica. En el procedimiento, se inserta una aguja y se extrae aproximadamente una pinta (470 ml o 0.47 l) de sangre. El adulto promedio tiene entre 9 y 12 pintas (4.3 y 5.7 l) de sangre en el cuerpo. Generalmente, este es un procedimiento seguro, pero en algunos casos puede causar problemas, tales como nuseas, mareos o presin arterial baja (hipotensin). Esta informacin no tiene Marine scientist el consejo del mdico. Asegrese de hacerle al mdico cualquier pregunta que tenga. Document Revised: 11/19/2020 Document Reviewed: 11/19/2020 Elsevier Patient Education  [place].

## 2021-11-01 NOTE — Progress Notes (Signed)
Gary Carpenter presents today for phlebotomy per MD orders. Phlebotomy procedure started at 0910 and ended at 0920. 540 grams removed. Patient observed for 30 minutes after procedure without any incident. Patient tolerated procedure well. IV needle removed intact.

## 2021-12-01 DIAGNOSIS — I10 Essential (primary) hypertension: Secondary | ICD-10-CM | POA: Diagnosis not present

## 2021-12-01 DIAGNOSIS — Z125 Encounter for screening for malignant neoplasm of prostate: Secondary | ICD-10-CM | POA: Diagnosis not present

## 2021-12-01 DIAGNOSIS — E119 Type 2 diabetes mellitus without complications: Secondary | ICD-10-CM | POA: Diagnosis not present

## 2021-12-01 DIAGNOSIS — E781 Pure hyperglyceridemia: Secondary | ICD-10-CM | POA: Diagnosis not present

## 2021-12-06 DIAGNOSIS — Z0000 Encounter for general adult medical examination without abnormal findings: Secondary | ICD-10-CM | POA: Diagnosis not present

## 2021-12-06 DIAGNOSIS — Z125 Encounter for screening for malignant neoplasm of prostate: Secondary | ICD-10-CM | POA: Diagnosis not present

## 2021-12-06 DIAGNOSIS — D751 Secondary polycythemia: Secondary | ICD-10-CM | POA: Diagnosis not present

## 2021-12-06 DIAGNOSIS — I10 Essential (primary) hypertension: Secondary | ICD-10-CM | POA: Diagnosis not present

## 2021-12-06 DIAGNOSIS — N529 Male erectile dysfunction, unspecified: Secondary | ICD-10-CM | POA: Diagnosis not present

## 2021-12-06 DIAGNOSIS — E118 Type 2 diabetes mellitus with unspecified complications: Secondary | ICD-10-CM | POA: Diagnosis not present

## 2021-12-06 DIAGNOSIS — I2510 Atherosclerotic heart disease of native coronary artery without angina pectoris: Secondary | ICD-10-CM | POA: Diagnosis not present

## 2022-01-19 DIAGNOSIS — I2510 Atherosclerotic heart disease of native coronary artery without angina pectoris: Secondary | ICD-10-CM | POA: Diagnosis not present

## 2022-02-16 ENCOUNTER — Encounter: Payer: Self-pay | Admitting: Cardiology

## 2022-02-16 ENCOUNTER — Ambulatory Visit: Payer: Medicare Other | Attending: Cardiology | Admitting: Cardiology

## 2022-02-16 VITALS — BP 158/90 | HR 81 | Ht 73.0 in | Wt 214.8 lb

## 2022-02-16 DIAGNOSIS — E1169 Type 2 diabetes mellitus with other specified complication: Secondary | ICD-10-CM | POA: Diagnosis not present

## 2022-02-16 DIAGNOSIS — Z8679 Personal history of other diseases of the circulatory system: Secondary | ICD-10-CM | POA: Diagnosis not present

## 2022-02-16 DIAGNOSIS — I10 Essential (primary) hypertension: Secondary | ICD-10-CM | POA: Insufficient documentation

## 2022-02-16 DIAGNOSIS — R931 Abnormal findings on diagnostic imaging of heart and coronary circulation: Secondary | ICD-10-CM | POA: Diagnosis not present

## 2022-02-16 DIAGNOSIS — E785 Hyperlipidemia, unspecified: Secondary | ICD-10-CM | POA: Diagnosis not present

## 2022-02-16 DIAGNOSIS — I351 Nonrheumatic aortic (valve) insufficiency: Secondary | ICD-10-CM | POA: Diagnosis not present

## 2022-02-16 MED ORDER — ROSUVASTATIN CALCIUM 10 MG PO TABS: 10.0000 mg | ORAL_TABLET | ORAL | 3 refills | Status: DC

## 2022-02-16 NOTE — Progress Notes (Signed)
Primary Care Provider: Deland Pretty, [place] Cardiologist: Glenetta Hew, MD Electrophysiologist: None Hematologist: Dr. Marin Olp  Clinic Note: Chief Complaint  Patient presents with   New Patient (Initial Visit)    Elevated Coronary Calcium Score   Coronary Artery Disease    Elevated Coronary Calcium Score noted-506.  No symptoms of chest pain or pressure.  No exertional dyspnea   ===================================  ASSESSMENT/PLAN   Problem List Items Addressed This Visit       Cardiology Problems   [place] coronary artery calcium score greater than 400 - Primary (Chronic)    Coronary Calcium Score of over 500 is somewhat concerning for presence of notable CAD, however Gary Carpenter is very asymptomatic not noting any chest tightness or pressure with rest exertion.  No dyspnea on exertion or heart failure symptoms of PND, orthopnea.  He is on a pretty good regimen to begin with.  Would want to target LDL level less than 70 and achieve A1c less than 6.  Need adequate blood pressure control.  Usually at home it is much better controlled and at PCPs office was 122/82.  Elevated today because of anxiety.  I spent the majority of visit discussing pathophysiology of atherosclerotic coronary disease as well as disease elsewhere I talked about what Coronary Calcium Score means and how it pertains to atherosclerotic disease..   I indicated that there is clearly evidence of CAD, but nothing to suggest obstructive disease or significant lesions at this point.  We talked about lifestyle modifications, to help treat his lipids.  Thankfully he is very active and with exercise and therefore we should build to tell if he is having any progression of symptoms.  Worried to have any concerning symptoms of exertional dyspnea or chest pain/pressure, low threshold to consider Coronary CTA..  In the absence of active symptoms, no indication for ischemic evaluation. Simply risk factor  modification with GDMT.  Plan: Was recently started back on low-dose Crestor 5 mg 3 days a week.  I will try to have him increase to 10 mg 3 days a week over the next month or so, if tolerated, with increased to 4 and then 5 days a week if tolerated. Agree with aspirin. He is already on losartan, not on beta-blocker which would be another option if blood pressure is high He is also on Ozempic with help from weight loss. Continue glycemic control and monitoring.      Relevant Medications   rosuvastatin (CRESTOR) 10 MG tablet   Other Relevant Orders   EKG 12-Lead (Completed)   Essential hypertension (Chronic)    Blood pressure is high today, PCPs office was 122/82.  Need to monitor but I think current Takotsubo anxiety.  Recheck was still 148/88 as bad.  For now, we will continue current dose of losartan and HCTZ (100 mg and 25 mg as separate dosing).  With concerns of CAD, The neck step would be a beta-blocker.  Resting heart rate on AVS here today was 81 bpm. I suspect he will be able to tolerate beta-blocker.      Relevant Medications   rosuvastatin (CRESTOR) 10 MG tablet   Hyperlipidemia associated with type 2 diabetes mellitus (HCC) (Chronic)    Target LDL should be less than 70 given extent of coronary artery disease on coronary calcium score.  Improvement in lipid management can stabilize existing plaque before becomes significant.  Been difficult to treat because of side effects noted.  Was restarted back on 3 days a  week rosuvastatin.  Plan will be to try to increase that from 5 to 10 mg dosing.  If tolerated, then potentially could further titrate up.  A1c was 6.7.  He is on Ozempic and metformin.  Monitored by PCP.  Follow closely as target A1c is less than 6.      Relevant Medications   rosuvastatin (CRESTOR) 10 MG tablet   Other Relevant Orders   EKG 12-Lead (Completed)     Other   Aortic ejection murmur (Chronic)    SEM noted on exam.  With history of rheumatic heart  disease as a child, we do need to exclude aortic stenosis.  The murmur does not sound to be significant, but has not been evaluated in years.  Plan: Check 2D echo      Relevant Orders   EKG 12-Lead (Completed)   ECHOCARDIOGRAM COMPLETE   History of rheumatic fever as a child (Chronic)    He was never told he had a murmur before, does have murmur on exam and I am concerned about the possibility of valvular disease setting up for possible SBE.  Also possibly just wear-and-tear of valves (.  Plan: Check 2D echo.      Relevant Orders   ECHOCARDIOGRAM COMPLETE   ===================================  HPI:    Gary Carpenter is a 81 y.o. male with PMH notable for DM-2 (oral meds), HLD (borderline statin intolerance), and ED who is being seen today for the evaluation of Elevated Coronary Calcium Score at the request of Deland Pretty, MD.  Gary Carpenter was seen by Dr. Deland Pretty on 12/06/2021 for annual follow-up/-health maintenance.  Noted that weight was stable on Ozempic.  No chest pain.  No dyspnea with rest or exertion.  No palpitations.  Recent Hospitalizations: None  Reviewed  CV studies:    The following studies were reviewed today: (if available, images/films reviewed: From Epic Chart or Care Everywhere) Coronary Calcium Score 12/28/2021: LAD 21, LCx 12, RCA 473.  Total 506.  Interval History:   Gary Carpenter presents today at the request of his PCP overall very stable cardiac standpoint.  He denies any active cardiac symptoms concerning for chest pain/angina, CHF symptoms, arrhythmias, syncope/near syncope or TIA/amaurosis fugax.  He is very active, routinely plays golf and walks long distances.  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  CV Review of Symptoms (Summary): no chest pain or dyspnea on exertion negative for - edema, irregular heartbeat, orthopnea, palpitations, paroxysmal nocturnal dyspnea, rapid heart rate, shortness  of breath, or syncope/near-syncope or TIA/amaurosis fugax, claudication.  REVIEWED OF SYSTEMS   Review of Systems  Constitutional:  Negative for malaise/fatigue and weight loss.  HENT:  Negative for congestion and nosebleeds.   Respiratory:  Negative for cough and shortness of breath.   Cardiovascular:  Negative for claudication.  Gastrointestinal:  Negative for blood in stool and melena.  Genitourinary:  Negative for hematuria.  Musculoskeletal:  Negative for joint pain and myalgias.  Neurological:  Negative for dizziness, focal weakness, weakness and headaches.  Psychiatric/Behavioral: Negative.     I have reviewed and (if needed) personally updated the patient's problem list, medications, allergies, past medical and surgical history, social and family history.   PAST MEDICAL HISTORY   Past Medical History:  Diagnosis Date   Allergy    seasonal allergies   Cataract 2018   sx to correct-bilateral   Diabetes mellitus without complication ([place])    on meds   GERD (gastroesophageal reflux  disease)    hx of   History of peptic ulcer disease    Thought related to aspirin use.   History of rheumatic fever as a child 8   Has never been diagnosed with cardiac issues   Hyperlipidemia    on meds; takes low-dose rosuvastatin 5 mg 3 days a week -> recently started   Hypertension    on meds   Hypertriglyceridemia    Polycythemia vera, acquired ([place])    Intermittent phlebotomy    PAST SURGICAL HISTORY   Past Surgical History:  Procedure Laterality Date   CATARACT EXTRACTION, BILATERAL Bilateral 2018   CERVICAL SPINE SURGERY  2003   2 disc replaced   COLONOSCOPY  2011   Gessner-moviprep-exc-F/V=HPP   [place] Bilateral    TONSILLECTOMY     by natural causes- "they rotted out when I was younger"   WISDOM TOOTH EXTRACTION      Immunization History  Administered Date(s) Administered   PFIZER(Purple  Top)SARS-COV-2 Vaccination 06/05/2019, 06/28/2019    MEDICATIONS/ALLERGIES   Current Meds  Medication Sig   aspirin 81 MG tablet Take 81 mg by mouth daily.     fish oil-omega-3 fatty acids 1000 MG capsule Take 1 g by mouth daily.     FREESTYLE LITE test strip U TO CHECK ONCE A DAY UTD   hydrochlorothiazide (HYDRODIURIL) 25 MG tablet Take 25 mg by mouth every morning.   losartan (COZAAR) 100 MG tablet Take 100 mg by mouth daily.   metFORMIN (GLUCOPHAGE-XR) 750 MG 24 hr tablet TK 1 T PO QD WITH THE EVE MEAL   OZEMPIC, 1 MG/DOSE, 4 MG/3ML SOPN Inject into the skin.   rosuvastatin (CRESTOR) 10 MG tablet Take 1 tablet (10 mg total) by mouth every Monday, Wednesday, and Friday. Or as directed   tadalafil (CIALIS) 10 MG tablet Take 10 mg by mouth daily as needed.   [DISCONTINUED] rosuvastatin (CRESTOR) 5 MG tablet Take 5 mg by mouth. Monday, Wednesday,Friday    Allergies  Allergen Reactions   Lidocaine     "made me loopy"   Atorvastatin Other (See Comments)    Myalgias and muscle weakness   Rosuvastatin Other (See Comments)    At high doses -> notes myalgias and muscle weakness.   Simvastatin Other (See Comments)    Muscle weakness    SOCIAL HISTORY/FAMILY HISTORY   Reviewed in Epic:   Social History   Tobacco Use   Smoking status: Former    Packs/day: 2.00    Years: 27.00    Total pack years: 54.00    Types: Cigarettes    Start date: 01/12/1957    Quit date: 04/14/1984    Years since quitting: 37.8   Smokeless tobacco: Never   Tobacco comments:    quit 30 years ago  Vaping Use   Vaping Use: Never used  Substance Use Topics   Alcohol use: Yes    Alcohol/week: 14.0 standard drinks of alcohol    Types: 14 Glasses of wine per week   Drug use: Never   Social History   Social History Narrative   He is currently divorced, but actively dating.   Father of 4: 2 sons and 2 daughters.;  8 grandchildren   Lives with a girlfriend   He does have children from his first wife.       Indicates that he is very active:   He previously used to walk routinely at least  2 miles a day up until summer 2023   Still plays golf 2 to 3 days a week.  Walks most the way totals close to 4-5 miles.      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   Family History  Problem Relation Age of Onset   Lung cancer Mother 4   Bone cancer Brother 35   Colon polyps Neg Hx    Colon cancer Neg Hx    Esophageal cancer Neg Hx    Stomach cancer Neg Hx    Rectal cancer Neg Hx    Coronary artery disease Neg Hx    Heart attack Neg Hx    Cardiomyopathy Neg Hx     OBJCTIVE -PE, EKG, labs   Wt Readings from Last 3 Encounters:  02/16/22 214 lb 12.8 oz (97.4 kg)  10/25/21 214 lb (97.1 kg)  04/26/21 204 lb 1.9 oz (92.6 kg)    Physical Exam: BP (!) 158/90   Pulse 81   Ht '6\' 1"'$  (1.854 m)   Wt 214 lb 12.8 oz (97.4 kg)   SpO2 98%   BMI 28.34 kg/m  Physical Exam Vitals reviewed.  Constitutional:      General: He is not in acute distress.    Appearance: Normal appearance. He is normal weight. He is not ill-appearing or toxic-appearing.     Comments: Oh weight with mild truncal obesity.  Well-groomed.  HENT:     Head: Normocephalic and atraumatic.  Neck:     Vascular: No carotid bruit or JVD.  Cardiovascular:     Rate and Rhythm: Normal rate and regular rhythm. No extrasystoles are present.    Chest Wall: PMI is not displaced.     Heart sounds: S1 normal. Murmur (2/6 SEM at RUSB.) heard.     No friction rub. No gallop.     Comments: Cannot exclude split S2. Musculoskeletal:     Cervical back: Normal range of motion and neck supple.  Neurological:     Mental Status: He is alert.     Adult ECG Report  Rate: 81;  Rhythm: normal sinus rhythm and 1  AVB, IRBBB ; otherwise normal axis, intervals and durations.  Narrative Interpretation: Reviewed  Recent Labs: Reviewed 11/30/2020: TC 187, TG 179, HDL 47, LDL 91. 07/07/2021: A1c 6.7; TC 197, TG  230, HDL 35, LDL 116. No results found for: "CHOL", "HDL", "LDLCALC", "LDLDIRECT", "TRIG", "CHOLHDL" Lab Results  Component Value Date   CREATININE 1.07 10/25/2021   BUN 16 10/25/2021   NA 136 10/25/2021   K 4.1 10/25/2021   CL 100 10/25/2021   CO2 30 10/25/2021      Latest Ref Rng & Units 10/25/2021   10:04 AM 04/26/2021   10:05 AM 11/02/2020    8:37 AM  CBC  WBC 4.0 - 10.5 K/uL 6.9  7.8  6.0   Hemoglobin 13.0 - 17.0 g/dL 17.0  17.4  17.2   Hematocrit 39.0 - 52.0 % 48.0  48.9  48.0   Platelets 150 - 400 K/uL 186  250  181     No results found for: "HGBA1C" No results found for: "TSH"  ================================================== I spent a total of 29 minutes with the patient spent in direct patient consultation.  Additional time spent with chart review  / charting (studies, outside notes, etc): 26 min Total Time: 54 min  Current medicines are reviewed at length with the patient today.  (+/- concerns) N/A  Notice: This dictation was  prepared with Dragon dictation along with Engineer, materials. Any transcriptional errors that result from this process are unintentional and may not be corrected upon review.   Studies Ordered:  Orders Placed This Encounter  Procedures   EKG 12-Lead   ECHOCARDIOGRAM COMPLETE   Meds ordered this encounter  Medications   rosuvastatin (CRESTOR) 10 MG tablet    Sig: Take 1 tablet (10 mg total) by mouth every Monday, Wednesday, and Friday. Or as directed    Dispense:  45 tablet    Refill:  3    Discontinue the 5 mg tablet    Patient Instructions / Medication Changes & Studies & Tests Ordered   Patient Instructions  Medication Instructions:    Increase Rosuvastatin to 10 mg  taking Monday , Wednesday < r=Friday   try taking for full month and then increasing to 4 times a week   *If you need a refill on your cardiac medications before your next appointment, please call your pharmacy*   Lab Work:  Not  needed   Testing/Procedures: Will be schedule at [facility] has requested that you have an echocardiogram. Echocardiography is a painless test that uses sound waves to create images of your heart. It provides your doctor with information about the size and shape of your heart and how well your heart's chambers and valves are working. This procedure takes approximately one hour. There are no restrictions for this procedure.    Follow-Up: At [facility], you and your health needs are our priority.  As part of our continuing mission to provide you with exceptional heart care, we have created designated Provider Care Teams.  These Care Teams include your primary Cardiologist (physician) and Advanced Practice Providers (APPs -  Physician Assistants and Nurse Practitioners) who all work together to provide you with the care you need, when you need it.     Your next appointment:   6 month(s)  The format for your next appointment:   In Person  Provider:   Glenetta Hew, MD        Leonie Man, MD, MS Glenetta Hew, M.D., M.S. Interventional Cardiologist  [place]  Pager # [phone] Phone # [phone] [address] [place], [place] [address]   Thank you for choosing [facility]!!

## 2022-02-16 NOTE — Patient Instructions (Addendum)
Medication Instructions:    Increase Rosuvastatin to 10 mg  taking Monday , Wednesday < r=Friday   try taking for full month and then increasing to 4 times a week   *If you need a refill on your cardiac medications before your next appointment, please call your pharmacy*   Lab Work:  Not needed   Testing/Procedures: Will be schedule at [facility] has requested that you have an echocardiogram. Echocardiography is a painless test that uses sound waves to create images of your heart. It provides your doctor with information about the size and shape of your heart and how well your heart's chambers and valves are working. This procedure takes approximately one hour. There are no restrictions for this procedure.    Follow-Up: At [facility] you and your health needs are our priority.  As part of our continuing mission to provide you with exceptional heart care, we have created designated Provider Care Teams.  These Care Teams include your primary Cardiologist (physician) and Advanced Practice Providers (APPs -  Physician Assistants and Nurse Practitioners) who all work together to provide you with the care you need, when you need it.     Your next appointment:   6 month(s)  The format for your next appointment:   In Person  Provider:   Glenetta Hew, MD

## 2022-02-20 ENCOUNTER — Encounter: Payer: Self-pay | Admitting: Cardiology

## 2022-02-20 NOTE — Assessment & Plan Note (Signed)
He was never told he had a murmur before, does have murmur on exam and I am concerned about the possibility of valvular disease setting up for possible SBE.  Also possibly just wear-and-tear of valves (.  Plan: Check 2D echo.

## 2022-02-20 NOTE — Assessment & Plan Note (Signed)
SEM noted on exam.  With history of rheumatic heart disease as a child, we do need to exclude aortic stenosis.  The murmur does not sound to be significant, but has not been evaluated in years.  Plan: Check 2D echo

## 2022-02-20 NOTE — Assessment & Plan Note (Signed)
Target LDL should be less than 70 given extent of coronary artery disease on coronary calcium score.  Improvement in lipid management can stabilize existing plaque before becomes significant.  Been difficult to treat because of side effects noted.  Was restarted back on 3 days a week rosuvastatin.  Plan will be to try to increase that from 5 to 10 mg dosing.  If tolerated, then potentially could further titrate up.  A1c was 6.7.  He is on Ozempic and metformin.  Monitored by PCP.  Follow closely as target A1c is less than 6.

## 2022-02-20 NOTE — Assessment & Plan Note (Addendum)
Blood pressure is high today, PCPs office was 122/82.  Need to monitor but I think current Takotsubo anxiety.  Recheck was still 148/88 as bad.  For now, we will continue current dose of losartan and HCTZ (100 mg and 25 mg as separate dosing).  With concerns of CAD, The neck step would be a beta-blocker.  Resting heart rate on AVS here today was 81 bpm. I suspect he will be able to tolerate beta-blocker.

## 2022-02-20 NOTE — Assessment & Plan Note (Signed)
Coronary Calcium Score of over 500 is somewhat concerning for presence of notable CAD, however [name] is very asymptomatic not noting any chest tightness or pressure with rest exertion.  No dyspnea on exertion or heart failure symptoms of PND, orthopnea.  He is on a pretty good regimen to begin with.  Would want to target LDL level less than 70 and achieve A1c less than 6.  Need adequate blood pressure control.  Usually at home it is much better controlled and at PCPs office was 122/82.  Elevated today because of anxiety.  I spent the majority of visit discussing pathophysiology of atherosclerotic coronary disease as well as disease elsewhere I talked about what Coronary Calcium Score means and how it pertains to atherosclerotic disease..   I indicated that there is clearly evidence of CAD, but nothing to suggest obstructive disease or significant lesions at this point.  We talked about lifestyle modifications, to help treat his lipids.  Thankfully he is very active and with exercise and therefore we should build to tell if he is having any progression of symptoms.  Worried to have any concerning symptoms of exertional dyspnea or chest pain/pressure, low threshold to consider Coronary CTA..  In the absence of active symptoms, no indication for ischemic evaluation. Simply risk factor modification with GDMT.  Plan:  Was recently started back on low-dose Crestor 5 mg 3 days a week.  I will try to have him increase to 10 mg 3 days a week over the next month or so, if tolerated, with increased to 4 and then 5 days a week if tolerated.  Agree with aspirin.  He is already on losartan, not on beta-blocker which would be another option if blood pressure is high  He is also on Ozempic with help from weight loss.  Continue glycemic control and monitoring.

## 2022-02-28 ENCOUNTER — Ambulatory Visit (HOSPITAL_COMMUNITY): Payer: Medicare Other | Attending: Cardiology

## 2022-02-28 DIAGNOSIS — I351 Nonrheumatic aortic (valve) insufficiency: Secondary | ICD-10-CM | POA: Insufficient documentation

## 2022-02-28 DIAGNOSIS — Z8679 Personal history of other diseases of the circulatory system: Secondary | ICD-10-CM | POA: Diagnosis not present

## 2022-02-28 LAB — ECHOCARDIOGRAM COMPLETE
AR max vel: 2.18 cm2
AV Area VTI: 2.21 cm2
AV Area mean vel: 2.01 cm2
AV Mean grad: 15 mmHg
AV Peak grad: 26.9 mmHg
Ao pk vel: 2.59 m/s
Area-P 1/2: 2.69 cm2
S' Lateral: 2.6 cm

## 2022-03-09 DIAGNOSIS — Z9849 Cataract extraction status, unspecified eye: Secondary | ICD-10-CM | POA: Diagnosis not present

## 2022-03-09 DIAGNOSIS — E119 Type 2 diabetes mellitus without complications: Secondary | ICD-10-CM | POA: Diagnosis not present

## 2022-03-09 DIAGNOSIS — H53143 Visual discomfort, bilateral: Secondary | ICD-10-CM | POA: Diagnosis not present

## 2022-03-09 DIAGNOSIS — Z961 Presence of intraocular lens: Secondary | ICD-10-CM | POA: Diagnosis not present

## 2022-03-09 DIAGNOSIS — H5203 Hypermetropia, bilateral: Secondary | ICD-10-CM | POA: Diagnosis not present

## 2022-03-09 DIAGNOSIS — H52223 Regular astigmatism, bilateral: Secondary | ICD-10-CM | POA: Diagnosis not present

## 2022-03-09 DIAGNOSIS — H35371 Puckering of macula, right eye: Secondary | ICD-10-CM | POA: Diagnosis not present

## 2022-03-16 DIAGNOSIS — I10 Essential (primary) hypertension: Secondary | ICD-10-CM | POA: Diagnosis not present

## 2022-03-16 DIAGNOSIS — E785 Hyperlipidemia, unspecified: Secondary | ICD-10-CM | POA: Diagnosis not present

## 2022-03-16 DIAGNOSIS — E1169 Type 2 diabetes mellitus with other specified complication: Secondary | ICD-10-CM | POA: Diagnosis not present

## 2022-03-16 DIAGNOSIS — E118 Type 2 diabetes mellitus with unspecified complications: Secondary | ICD-10-CM | POA: Diagnosis not present

## 2022-03-16 DIAGNOSIS — I2510 Atherosclerotic heart disease of native coronary artery without angina pectoris: Secondary | ICD-10-CM | POA: Diagnosis not present

## 2022-03-16 DIAGNOSIS — Z23 Encounter for immunization: Secondary | ICD-10-CM | POA: Diagnosis not present

## 2022-04-06 IMAGING — DX DG KNEE AP/LAT W/ SUNRISE*R*
3 series · 3 of 3 positions shown · non-contrast
Comparison: None.

CLINICAL DATA: Right knee pain

EXAM:
RIGHT KNEE 3 VIEWS

[knee ap]
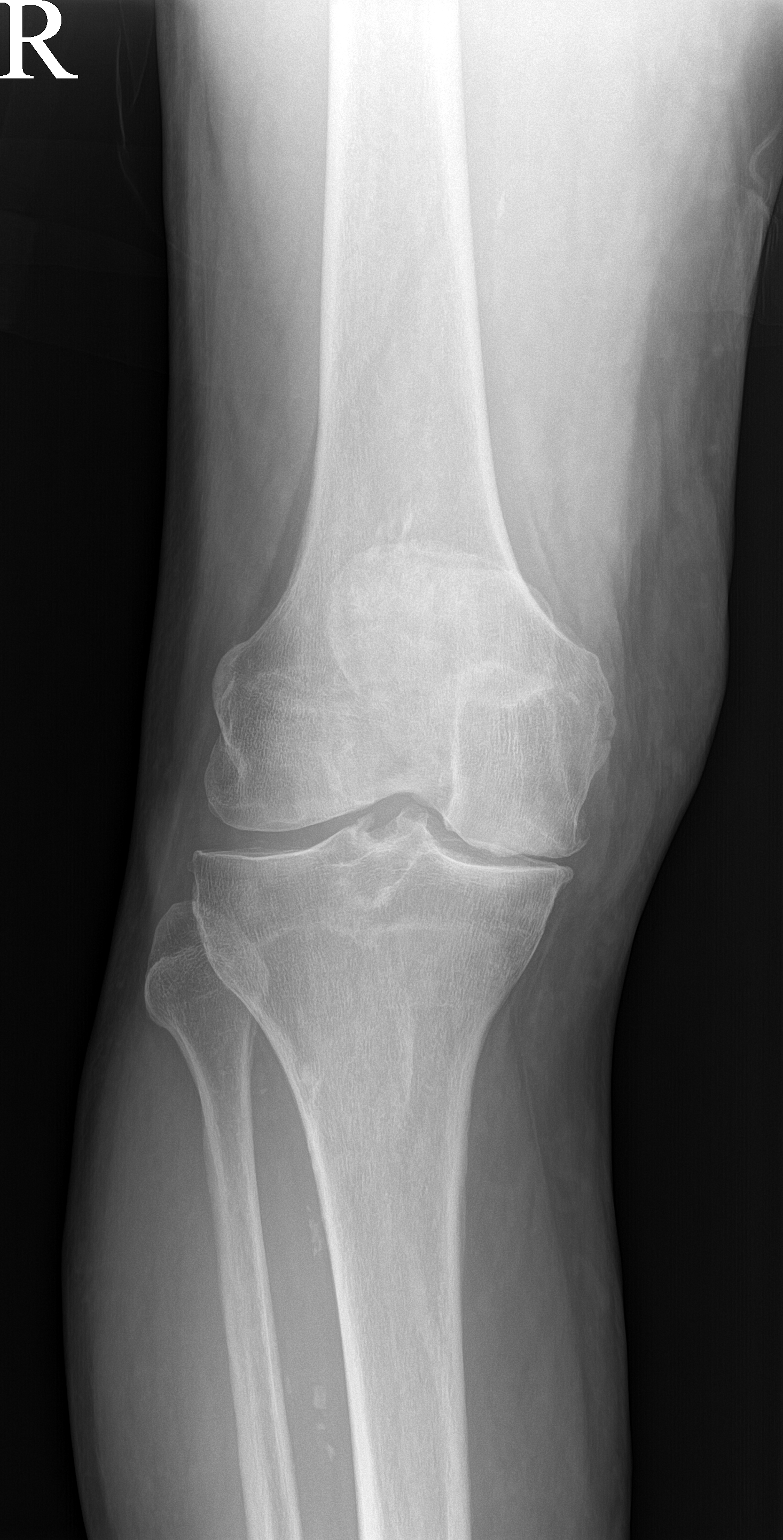

[knee lat]
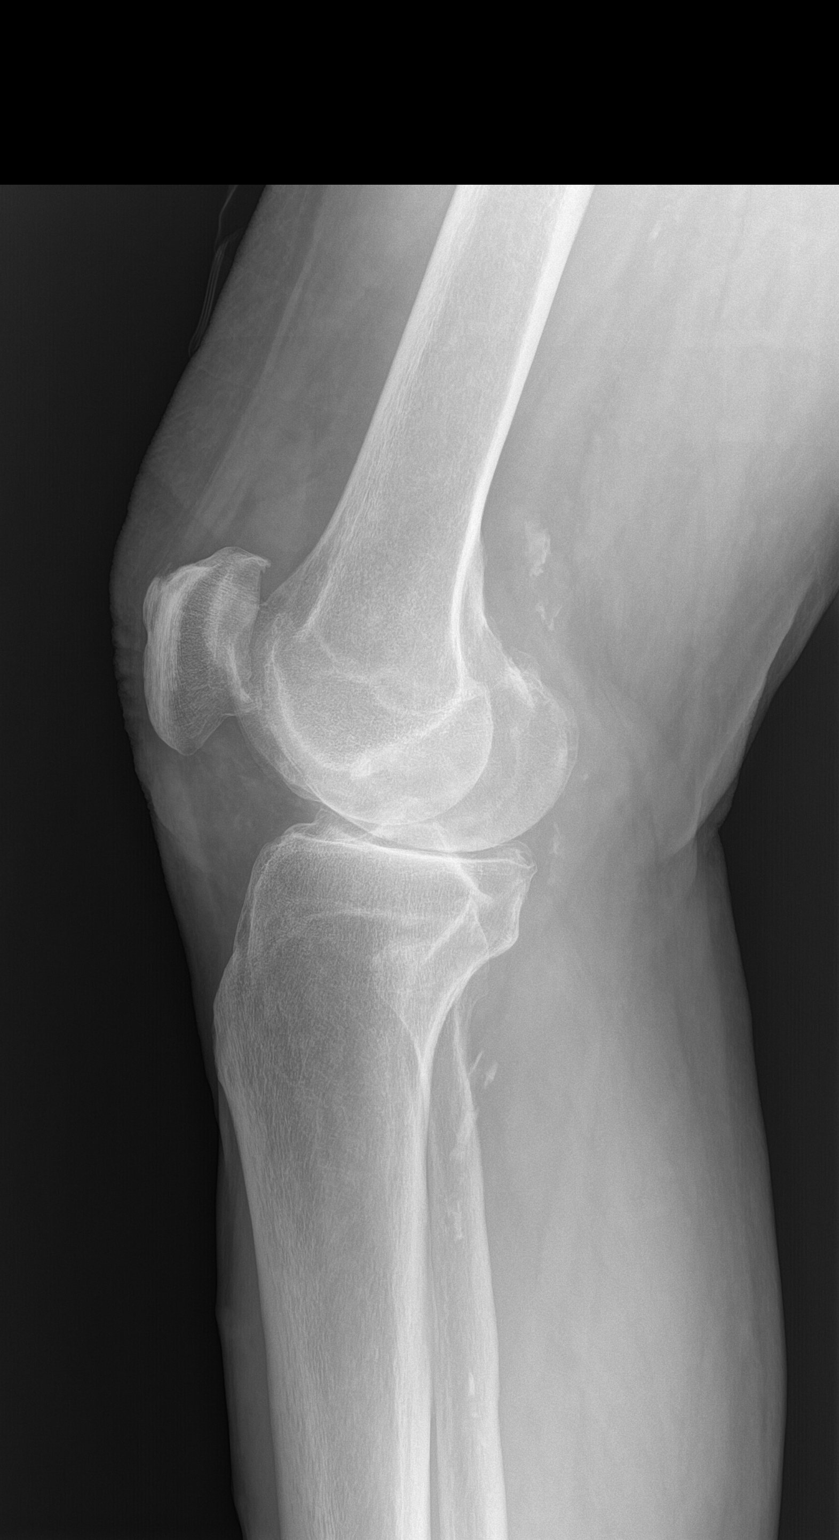

[patella]
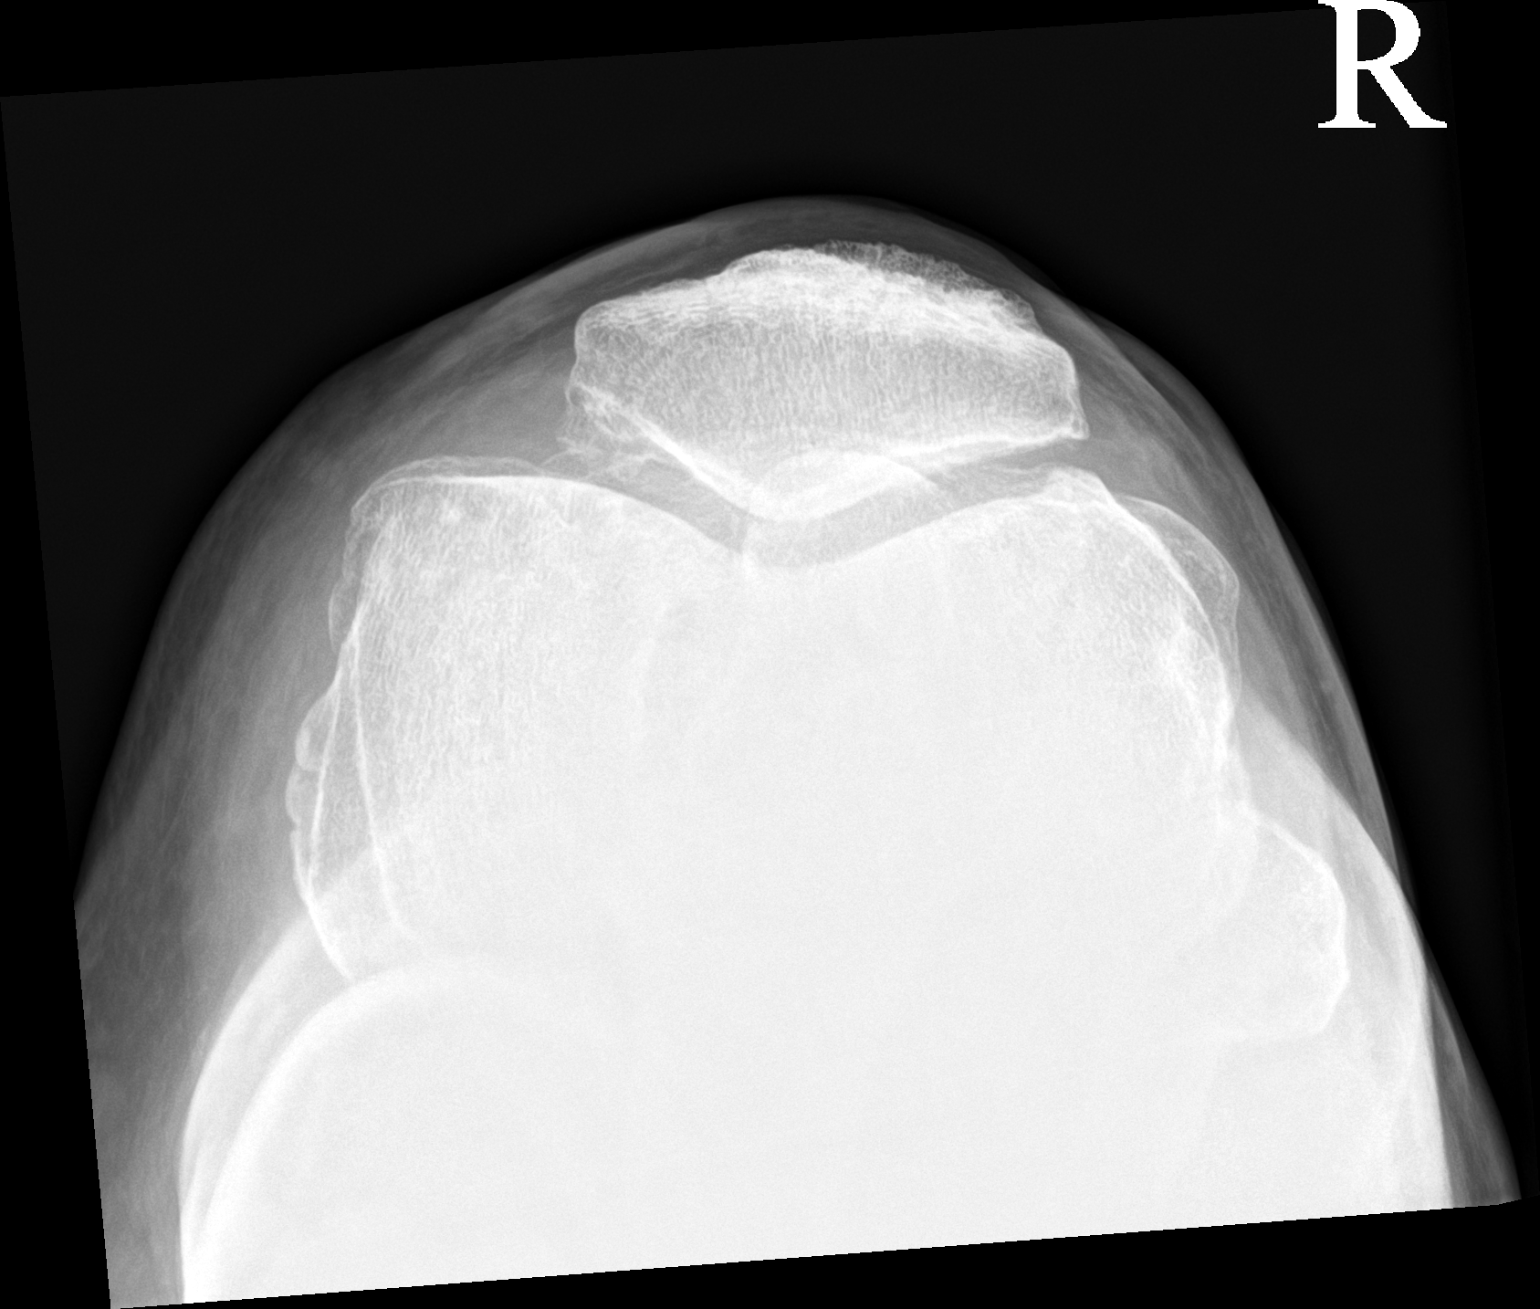

[3 of 3 positions shown; findings below may reference images not displayed]

FINDINGS: Three view radiograph right knee demonstrates mild overall varus
angulation. No fracture or dislocation. There is mild to moderate
tricompartmental degenerative arthritis, most severe within the
medial compartment. Small effusion. Vascular calcifications are seen
within the posterior soft tissue.
IMPRESSION: Mild to moderate tricompartmental degenerative arthritis.

Small right knee effusion.

## 2022-04-26 ENCOUNTER — Ambulatory Visit: Payer: No Typology Code available for payment source | Admitting: Hematology & Oncology

## 2022-04-26 ENCOUNTER — Other Ambulatory Visit: Payer: No Typology Code available for payment source

## 2022-05-11 ENCOUNTER — Inpatient Hospital Stay: Payer: Medicare Other | Attending: Hematology & Oncology | Admitting: Hematology & Oncology

## 2022-05-11 ENCOUNTER — Inpatient Hospital Stay: Payer: Medicare Other

## 2022-05-11 ENCOUNTER — Other Ambulatory Visit: Payer: Self-pay

## 2022-05-11 ENCOUNTER — Encounter: Payer: Self-pay | Admitting: Hematology & Oncology

## 2022-05-11 VITALS — BP 163/77 | HR 66 | Temp 97.7°F | Resp 18 | Wt 213.0 lb

## 2022-05-11 DIAGNOSIS — D751 Secondary polycythemia: Secondary | ICD-10-CM | POA: Diagnosis not present

## 2022-05-11 DIAGNOSIS — R799 Abnormal finding of blood chemistry, unspecified: Secondary | ICD-10-CM | POA: Diagnosis not present

## 2022-05-11 DIAGNOSIS — Z7982 Long term (current) use of aspirin: Secondary | ICD-10-CM | POA: Diagnosis not present

## 2022-05-11 LAB — CMP (CANCER CENTER ONLY)
ALT: 21 U/L (ref 0–44)
AST: 20 U/L (ref 15–41)
Albumin: 4.5 g/dL (ref 3.5–5.0)
Alkaline Phosphatase: 63 U/L (ref 38–126)
Anion gap: 8 (ref 5–15)
BUN: 16 mg/dL (ref 8–23)
CO2: 31 mmol/L (ref 22–32)
Calcium: 10.6 mg/dL — ABNORMAL HIGH (ref 8.9–10.3)
Chloride: 100 mmol/L (ref 98–111)
Creatinine: 1.09 mg/dL (ref 0.61–1.24)
GFR, Estimated: >60 mL/min (ref >60)
Glucose, Bld: 161 mg/dL — ABNORMAL HIGH (ref 70–99)
Potassium: 4.5 mmol/L (ref 3.5–5.1)
Sodium: 139 mmol/L (ref 135–145)
Total Bilirubin: 0.8 mg/dL (ref 0.3–1.2)
Total Protein: 7.2 g/dL (ref 6.5–8.1)

## 2022-05-11 LAB — CBC WITH DIFFERENTIAL (CANCER CENTER ONLY)
Abs Immature Granulocytes: 0.02 10*3/uL (ref 0.00–0.07)
Basophils Absolute: 0 10*3/uL (ref 0.0–0.1)
Basophils Relative: 1 %
Eosinophils Absolute: 0.1 10*3/uL (ref 0.0–0.5)
Eosinophils Relative: 2 %
HCT: 47.7 % (ref 39.0–52.0)
Hemoglobin: 16.9 g/dL (ref 13.0–17.0)
Immature Granulocytes: 0 %
Lymphocytes Relative: 25 %
Lymphs Abs: 1.7 10*3/uL (ref 0.7–4.0)
MCH: 31.6 pg (ref 26.0–34.0)
MCHC: 35.4 g/dL (ref 30.0–36.0)
MCV: 89.2 fL (ref 80.0–100.0)
Monocytes Absolute: 0.6 10*3/uL (ref 0.1–1.0)
Monocytes Relative: 10 %
Neutro Abs: 4.1 10*3/uL (ref 1.7–7.7)
Neutrophils Relative %: 62 %
Platelet Count: 178 10*3/uL (ref 150–400)
RBC: 5.35 MIL/uL (ref 4.22–5.81)
RDW: 12.8 % (ref 11.5–15.5)
WBC Count: 6.5 10*3/uL (ref 4.0–10.5)
nRBC: 0 % (ref 0.0–0.2)

## 2022-05-11 LAB — IRON AND IRON BINDING CAPACITY (CC-WL,HP ONLY)
Iron: 100 ug/dL (ref 45–182)
Saturation Ratios: 28 % (ref 17.9–39.5)
TIBC: 360 ug/dL (ref 250–450)
UIBC: 260 ug/dL (ref 117–376)

## 2022-05-11 LAB — FERRITIN: Ferritin: 32 ng/mL (ref 24–336)

## 2022-05-11 NOTE — Patient Instructions (Signed)

## 2022-05-11 NOTE — Progress Notes (Signed)
Gary Carpenter presents today for phlebotomy per MD orders. Phlebotomy procedure started at 0820 and ended at 0830. 525 cc removed.refreshments given. patient preferred not to stay the post 30 minutes. Patient tolerated procedure well. IV needle removed intact.

## 2022-05-11 NOTE — Progress Notes (Signed)
Hematology and Oncology Follow Up Visit  Gary Carpenter [id number] 07-17-1940 81 y.o. 05/11/2022   Principle Diagnosis:  Spurious polycythemia  Current Therapy:   Phlebotomy to maintain hematocrit less than 45% Aspirin 81 mg by mouth daily     Interim History:  [name] is back for followup.  We last saw him 6 months ago.  Since then, he has been doing okay.  The main problem is that he has had sciatica.  This is in his right leg right now.  He does not wish to have surgery.  He does not wish to have any injections.  He is doing well on the Ozempic.  He says he is keeping his hemoglobin A1c in the 6 range.  He has had no problems with nausea or vomiting.  He has had no headache.  He has had no change in bowel or bladder habits.  His last iron studies showed a ferritin of 93 with an iron saturation of 38%.  He has had no fever.  There is been no issues with COVID.  He has had no problems with rashes.  He has had no bleeding.  He has had no headache.  He is doing well on the baby aspirin.  Overall, I would say his performance status is probably ECOG 1.  Medications:  Current Outpatient Medications:    aspirin 81 MG tablet, Take 81 mg by mouth daily.  , Disp: , Rfl:    fish oil-omega-3 fatty acids 1000 MG capsule, Take 1 g by mouth daily.  , Disp: , Rfl:    FREESTYLE LITE test strip, U TO CHECK ONCE A DAY UTD, Disp: , Rfl: 3   hydrochlorothiazide (HYDRODIURIL) 25 MG tablet, Take 25 mg by mouth every morning., Disp: , Rfl:    losartan (COZAAR) 100 MG tablet, Take 100 mg by mouth daily., Disp: , Rfl:    metFORMIN (GLUCOPHAGE-XR) 750 MG 24 hr tablet, TK 1 T PO QD WITH THE EVE MEAL, Disp: , Rfl: 4   OZEMPIC, 1 MG/DOSE, 4 MG/3ML SOPN, Inject into the skin., Disp: , Rfl:    rosuvastatin (CRESTOR) 10 MG tablet, Take 1 tablet (10 mg total) by mouth every Monday, Wednesday, and Friday. Or as directed, Disp: 45 tablet, Rfl: 3   tadalafil (CIALIS) 10 MG tablet, Take 10 mg by mouth daily  as needed., Disp: , Rfl:   Allergies:  Allergies  Allergen Reactions   Lidocaine     "made me loopy"   Atorvastatin Other (See Comments)    Myalgias and muscle weakness   Rosuvastatin Other (See Comments)    At high doses -> notes myalgias and muscle weakness.   Simvastatin Other (See Comments)    Muscle weakness    Past Medical History, Surgical history, Social history, and Family History were reviewed and updated.  Review of Systems: Review of Systems  Constitutional: Negative.   HENT: Negative.    Eyes: Negative.   Respiratory: Negative.    Cardiovascular: Negative.   Gastrointestinal: Negative.   Genitourinary: Negative.   Musculoskeletal: Negative.   Skin: Negative.   Neurological: Negative.   Endo/Heme/Allergies: Negative.   Psychiatric/Behavioral: Negative.       Physical Exam:  weight is 213 lb (96.6 kg). His temperature is 97.7 F (36.5 C). His blood pressure is 163/77 (abnormal) and his pulse is 66. His respiration is 18 and oxygen saturation is 95%.   Physical Exam Vitals reviewed.  HENT:     Head: Normocephalic and atraumatic.  Eyes:     Pupils: Pupils are equal, round, and reactive to light.  Cardiovascular:     Rate and Rhythm: Normal rate and regular rhythm.     Heart sounds: Normal heart sounds.  Pulmonary:     Effort: Pulmonary effort is normal.     Breath sounds: Normal breath sounds.  Abdominal:     General: Bowel sounds are normal.     Palpations: Abdomen is soft.  Musculoskeletal:        General: No tenderness or deformity. Normal range of motion.     Cervical back: Normal range of motion.  Lymphadenopathy:     Cervical: No cervical adenopathy.  Skin:    General: Skin is warm and dry.     Findings: No erythema or rash.  Neurological:     Mental Status: He is alert and oriented to person, place, and time.  Psychiatric:        Behavior: Behavior normal.        Thought Content: Thought content normal.        Judgment: Judgment  normal.      Lab Results  Component Value Date   WBC 6.5 05/11/2022   HGB 16.9 05/11/2022   HCT 47.7 05/11/2022   MCV 89.2 05/11/2022   PLT 178 05/11/2022     Chemistry      Component Value Date/Time   NA 136 10/25/2021 1004   NA 143 03/29/2017 0856   NA 137 03/28/2016 0841   K 4.1 10/25/2021 1004   K 4.0 03/29/2017 0856   K 4.2 03/28/2016 0841   CL 100 10/25/2021 1004   CL 101 03/29/2017 0856   CO2 30 10/25/2021 1004   CO2 30 03/29/2017 0856   CO2 25 03/28/2016 0841   BUN 16 10/25/2021 1004   BUN 14 03/29/2017 0856   BUN 16.4 03/28/2016 0841   CREATININE 1.07 10/25/2021 1004   CREATININE 0.8 03/29/2017 0856   CREATININE 1.0 03/28/2016 0841      Component Value Date/Time   CALCIUM 10.0 10/25/2021 1004   CALCIUM 9.7 03/29/2017 0856   CALCIUM 9.7 03/28/2016 0841   ALKPHOS 57 10/25/2021 1004   ALKPHOS 86 (H) 03/29/2017 0856   ALKPHOS 78 03/28/2016 0841   AST 17 10/25/2021 1004   AST 23 03/28/2016 0841   ALT 18 10/25/2021 1004   ALT 41 03/29/2017 0856   ALT 34 03/28/2016 0841   BILITOT 0.7 10/25/2021 1004   BILITOT 1.14 03/28/2016 0841      Impression and Plan:  Gary Carpenter is a 81 year old gentleman. He has spurious polycythemia. However, we are keeping his hematocrit below 45%. This does make him feel better.  I will go ahead and phlebotomize him today.    I hate that he has the sciatica.  I know that this is troubling him.  Again, I am sure it sounds like spinal stenosis.  We will go ahead and get back another 6 months.  This seems to work pretty well for him.    12/27/20238:16 AM

## 2022-05-23 DIAGNOSIS — E119 Type 2 diabetes mellitus without complications: Secondary | ICD-10-CM | POA: Diagnosis not present

## 2022-05-23 DIAGNOSIS — H26492 Other secondary cataract, left eye: Secondary | ICD-10-CM | POA: Diagnosis not present

## 2022-05-23 DIAGNOSIS — Z961 Presence of intraocular lens: Secondary | ICD-10-CM | POA: Diagnosis not present

## 2022-05-23 DIAGNOSIS — I10 Essential (primary) hypertension: Secondary | ICD-10-CM | POA: Diagnosis not present

## 2022-06-01 DIAGNOSIS — Z9842 Cataract extraction status, left eye: Secondary | ICD-10-CM | POA: Diagnosis not present

## 2022-06-01 DIAGNOSIS — Z961 Presence of intraocular lens: Secondary | ICD-10-CM | POA: Diagnosis not present

## 2022-06-01 DIAGNOSIS — H2512 Age-related nuclear cataract, left eye: Secondary | ICD-10-CM | POA: Diagnosis not present

## 2022-06-08 DIAGNOSIS — E1169 Type 2 diabetes mellitus with other specified complication: Secondary | ICD-10-CM | POA: Diagnosis not present

## 2022-06-08 DIAGNOSIS — E785 Hyperlipidemia, unspecified: Secondary | ICD-10-CM | POA: Diagnosis not present

## 2022-06-08 DIAGNOSIS — I2510 Atherosclerotic heart disease of native coronary artery without angina pectoris: Secondary | ICD-10-CM | POA: Diagnosis not present

## 2022-06-13 DIAGNOSIS — H26491 Other secondary cataract, right eye: Secondary | ICD-10-CM | POA: Diagnosis not present

## 2022-06-15 DIAGNOSIS — I10 Essential (primary) hypertension: Secondary | ICD-10-CM | POA: Diagnosis not present

## 2022-06-15 DIAGNOSIS — I2510 Atherosclerotic heart disease of native coronary artery without angina pectoris: Secondary | ICD-10-CM | POA: Diagnosis not present

## 2022-06-15 DIAGNOSIS — E1169 Type 2 diabetes mellitus with other specified complication: Secondary | ICD-10-CM | POA: Diagnosis not present

## 2022-06-15 DIAGNOSIS — E785 Hyperlipidemia, unspecified: Secondary | ICD-10-CM | POA: Diagnosis not present

## 2022-06-20 DIAGNOSIS — Z961 Presence of intraocular lens: Secondary | ICD-10-CM | POA: Diagnosis not present

## 2022-06-20 DIAGNOSIS — H5203 Hypermetropia, bilateral: Secondary | ICD-10-CM | POA: Diagnosis not present

## 2022-06-20 DIAGNOSIS — Z9841 Cataract extraction status, right eye: Secondary | ICD-10-CM | POA: Diagnosis not present

## 2022-06-20 DIAGNOSIS — H524 Presbyopia: Secondary | ICD-10-CM | POA: Diagnosis not present

## 2022-06-20 DIAGNOSIS — H52223 Regular astigmatism, bilateral: Secondary | ICD-10-CM | POA: Diagnosis not present

## 2022-07-06 DIAGNOSIS — E785 Hyperlipidemia, unspecified: Secondary | ICD-10-CM | POA: Diagnosis not present

## 2022-07-06 DIAGNOSIS — E1169 Type 2 diabetes mellitus with other specified complication: Secondary | ICD-10-CM | POA: Diagnosis not present

## 2022-07-06 DIAGNOSIS — I2510 Atherosclerotic heart disease of native coronary artery without angina pectoris: Secondary | ICD-10-CM | POA: Diagnosis not present

## 2022-07-06 DIAGNOSIS — I10 Essential (primary) hypertension: Secondary | ICD-10-CM | POA: Diagnosis not present

## 2022-07-15 DIAGNOSIS — H57813 Brow ptosis, bilateral: Secondary | ICD-10-CM | POA: Diagnosis not present

## 2022-07-15 DIAGNOSIS — H0279 Other degenerative disorders of eyelid and periocular area: Secondary | ICD-10-CM | POA: Diagnosis not present

## 2022-07-15 DIAGNOSIS — H02834 Dermatochalasis of left upper eyelid: Secondary | ICD-10-CM | POA: Diagnosis not present

## 2022-07-15 DIAGNOSIS — H02422 Myogenic ptosis of left eyelid: Secondary | ICD-10-CM | POA: Diagnosis not present

## 2022-07-15 DIAGNOSIS — H53483 Generalized contraction of visual field, bilateral: Secondary | ICD-10-CM | POA: Diagnosis not present

## 2022-07-15 DIAGNOSIS — H02423 Myogenic ptosis of bilateral eyelids: Secondary | ICD-10-CM | POA: Diagnosis not present

## 2022-07-15 DIAGNOSIS — H02413 Mechanical ptosis of bilateral eyelids: Secondary | ICD-10-CM | POA: Diagnosis not present

## 2022-07-15 DIAGNOSIS — H02831 Dermatochalasis of right upper eyelid: Secondary | ICD-10-CM | POA: Diagnosis not present

## 2022-07-15 DIAGNOSIS — H02421 Myogenic ptosis of right eyelid: Secondary | ICD-10-CM | POA: Diagnosis not present

## 2022-07-27 DIAGNOSIS — E785 Hyperlipidemia, unspecified: Secondary | ICD-10-CM | POA: Diagnosis not present

## 2022-07-27 DIAGNOSIS — I10 Essential (primary) hypertension: Secondary | ICD-10-CM | POA: Diagnosis not present

## 2022-07-27 DIAGNOSIS — I2510 Atherosclerotic heart disease of native coronary artery without angina pectoris: Secondary | ICD-10-CM | POA: Diagnosis not present

## 2022-07-27 DIAGNOSIS — E1169 Type 2 diabetes mellitus with other specified complication: Secondary | ICD-10-CM | POA: Diagnosis not present

## 2022-08-03 DIAGNOSIS — H53483 Generalized contraction of visual field, bilateral: Secondary | ICD-10-CM | POA: Diagnosis not present

## 2022-09-05 DIAGNOSIS — L820 Inflamed seborrheic keratosis: Secondary | ICD-10-CM | POA: Diagnosis not present

## 2022-09-05 DIAGNOSIS — L821 Other seborrheic keratosis: Secondary | ICD-10-CM | POA: Diagnosis not present

## 2022-09-05 DIAGNOSIS — D2371 Other benign neoplasm of skin of right lower limb, including hip: Secondary | ICD-10-CM | POA: Diagnosis not present

## 2022-09-05 DIAGNOSIS — L570 Actinic keratosis: Secondary | ICD-10-CM | POA: Diagnosis not present

## 2022-09-05 DIAGNOSIS — L281 Prurigo nodularis: Secondary | ICD-10-CM | POA: Diagnosis not present

## 2022-09-05 DIAGNOSIS — L723 Sebaceous cyst: Secondary | ICD-10-CM | POA: Diagnosis not present

## 2022-09-05 DIAGNOSIS — Z85828 Personal history of other malignant neoplasm of skin: Secondary | ICD-10-CM | POA: Diagnosis not present

## 2022-09-05 DIAGNOSIS — D171 Benign lipomatous neoplasm of skin and subcutaneous tissue of trunk: Secondary | ICD-10-CM | POA: Diagnosis not present

## 2022-09-07 ENCOUNTER — Encounter: Payer: Self-pay | Admitting: Family

## 2022-09-07 ENCOUNTER — Other Ambulatory Visit (HOSPITAL_COMMUNITY): Payer: Self-pay

## 2022-09-07 MED ORDER — OZEMPIC (1 MG/DOSE) 4 MG/3ML ~~LOC~~ SOPN
1.0000 mg | PEN_INJECTOR | SUBCUTANEOUS | 5 refills | Status: DC
  Filled 2022-09-07: qty 3, 28d supply, fill #0

## 2022-09-16 ENCOUNTER — Other Ambulatory Visit (HOSPITAL_COMMUNITY): Payer: Self-pay

## 2022-09-16 MED ORDER — ROSUVASTATIN CALCIUM 10 MG PO TABS
10.0000 mg | ORAL_TABLET | ORAL | 3 refills | Status: AC
  Filled 2022-09-16: qty 36, 84d supply, fill #0
  Filled 2022-12-12: qty 36, 84d supply, fill #1
  Filled 2023-03-06: qty 36, 84d supply, fill #2
  Filled 2023-06-01: qty 36, 84d supply, fill #3
  Filled 2023-08-21: qty 36, 84d supply, fill #4

## 2022-09-19 ENCOUNTER — Other Ambulatory Visit (HOSPITAL_COMMUNITY): Payer: Self-pay

## 2022-09-19 MED ORDER — ERYTHROMYCIN 5 MG/GM OP OINT
TOPICAL_OINTMENT | OPHTHALMIC | 0 refills | Status: DC
  Filled 2022-09-19: qty 3.5, 7d supply, fill #0

## 2022-09-26 DIAGNOSIS — H53483 Generalized contraction of visual field, bilateral: Secondary | ICD-10-CM | POA: Diagnosis not present

## 2022-09-26 DIAGNOSIS — H02831 Dermatochalasis of right upper eyelid: Secondary | ICD-10-CM | POA: Diagnosis not present

## 2022-09-26 DIAGNOSIS — H02834 Dermatochalasis of left upper eyelid: Secondary | ICD-10-CM | POA: Diagnosis not present

## 2022-09-26 DIAGNOSIS — H02423 Myogenic ptosis of bilateral eyelids: Secondary | ICD-10-CM | POA: Diagnosis not present

## 2022-09-26 DIAGNOSIS — H02421 Myogenic ptosis of right eyelid: Secondary | ICD-10-CM | POA: Diagnosis not present

## 2022-09-26 DIAGNOSIS — H02422 Myogenic ptosis of left eyelid: Secondary | ICD-10-CM | POA: Diagnosis not present

## 2022-09-26 DIAGNOSIS — H57813 Brow ptosis, bilateral: Secondary | ICD-10-CM | POA: Diagnosis not present

## 2022-09-26 DIAGNOSIS — H0279 Other degenerative disorders of eyelid and periocular area: Secondary | ICD-10-CM | POA: Diagnosis not present

## 2022-09-26 DIAGNOSIS — H53453 Other localized visual field defect, bilateral: Secondary | ICD-10-CM | POA: Diagnosis not present

## 2022-09-26 DIAGNOSIS — H02413 Mechanical ptosis of bilateral eyelids: Secondary | ICD-10-CM | POA: Diagnosis not present

## 2022-10-17 ENCOUNTER — Other Ambulatory Visit (HOSPITAL_COMMUNITY): Payer: Self-pay

## 2022-10-17 MED ORDER — OLMESARTAN MEDOXOMIL 40 MG PO TABS
40.0000 mg | ORAL_TABLET | Freq: Every day | ORAL | 3 refills | Status: DC
  Filled 2022-10-17: qty 90, 90d supply, fill #0
  Filled 2023-01-13: qty 90, 90d supply, fill #1
  Filled 2023-04-12: qty 90, 90d supply, fill #2
  Filled 2023-07-17: qty 90, 90d supply, fill #3

## 2022-10-17 MED ORDER — HYDROCHLOROTHIAZIDE 25 MG PO TABS
25.0000 mg | ORAL_TABLET | Freq: Every morning | ORAL | 3 refills | Status: AC
  Filled 2022-10-17: qty 90, 90d supply, fill #0
  Filled 2023-01-13: qty 90, 90d supply, fill #1
  Filled 2023-04-12: qty 90, 90d supply, fill #2
  Filled 2023-07-17: qty 90, 90d supply, fill #3

## 2022-10-20 DIAGNOSIS — E1169 Type 2 diabetes mellitus with other specified complication: Secondary | ICD-10-CM | POA: Diagnosis not present

## 2022-10-20 DIAGNOSIS — I2510 Atherosclerotic heart disease of native coronary artery without angina pectoris: Secondary | ICD-10-CM | POA: Diagnosis not present

## 2022-10-20 DIAGNOSIS — I10 Essential (primary) hypertension: Secondary | ICD-10-CM | POA: Diagnosis not present

## 2022-10-20 DIAGNOSIS — E785 Hyperlipidemia, unspecified: Secondary | ICD-10-CM | POA: Diagnosis not present

## 2022-10-31 ENCOUNTER — Other Ambulatory Visit (HOSPITAL_COMMUNITY): Payer: Self-pay

## 2022-10-31 MED ORDER — OZEMPIC (1 MG/DOSE) 4 MG/3ML ~~LOC~~ SOPN
1.0000 mg | PEN_INJECTOR | SUBCUTANEOUS | 3 refills | Status: AC
  Filled 2022-10-31: qty 9, 84d supply, fill #0
  Filled 2023-03-06: qty 9, 84d supply, fill #1
  Filled 2023-05-25: qty 9, 84d supply, fill #2
  Filled 2023-08-18: qty 9, 84d supply, fill #3

## 2022-11-02 ENCOUNTER — Encounter: Payer: Self-pay | Admitting: Family

## 2022-11-09 ENCOUNTER — Encounter: Payer: Self-pay | Admitting: Hematology & Oncology

## 2022-11-09 ENCOUNTER — Inpatient Hospital Stay: Payer: Medicare Other | Attending: Hematology & Oncology

## 2022-11-09 ENCOUNTER — Inpatient Hospital Stay: Payer: Medicare Other

## 2022-11-09 ENCOUNTER — Inpatient Hospital Stay (HOSPITAL_BASED_OUTPATIENT_CLINIC_OR_DEPARTMENT_OTHER): Payer: Medicare Other | Admitting: Hematology & Oncology

## 2022-11-09 VITALS — BP 152/72 | HR 62

## 2022-11-09 VITALS — Ht 73.0 in | Wt 216.1 lb

## 2022-11-09 DIAGNOSIS — D751 Secondary polycythemia: Secondary | ICD-10-CM

## 2022-11-09 DIAGNOSIS — R799 Abnormal finding of blood chemistry, unspecified: Secondary | ICD-10-CM

## 2022-11-09 DIAGNOSIS — R748 Abnormal levels of other serum enzymes: Secondary | ICD-10-CM | POA: Diagnosis not present

## 2022-11-09 LAB — CMP (CANCER CENTER ONLY)
ALT: 18 U/L (ref 0–44)
AST: 20 U/L (ref 15–41)
Albumin: 4.5 g/dL (ref 3.5–5.0)
Alkaline Phosphatase: 63 U/L (ref 38–126)
Anion gap: 6 (ref 5–15)
BUN: 14 mg/dL (ref 8–23)
CO2: 28 mmol/L (ref 22–32)
Calcium: 10 mg/dL (ref 8.9–10.3)
Chloride: 102 mmol/L (ref 98–111)
Creatinine: 0.94 mg/dL (ref 0.61–1.24)
GFR, Estimated: >60 mL/min (ref >60)
Glucose, Bld: 166 mg/dL — ABNORMAL HIGH (ref 70–99)
Potassium: 4.3 mmol/L (ref 3.5–5.1)
Sodium: 136 mmol/L (ref 135–145)
Total Bilirubin: 0.6 mg/dL (ref 0.3–1.2)
Total Protein: 6.6 g/dL (ref 6.5–8.1)

## 2022-11-09 LAB — IRON AND IRON BINDING CAPACITY (CC-WL,HP ONLY)
Iron: 71 ug/dL (ref 45–182)
Saturation Ratios: 20 % (ref 17.9–39.5)
TIBC: 347 ug/dL (ref 250–450)
UIBC: 276 ug/dL (ref 117–376)

## 2022-11-09 LAB — RETICULOCYTES
Immature Retic Fract: 16.1 % — ABNORMAL HIGH (ref 2.3–15.9)
RBC.: 5.24 MIL/uL (ref 4.22–5.81)
Retic Count, Absolute: 120 10*3/uL (ref 19.0–186.0)
Retic Ct Pct: 2.3 % (ref 0.4–3.1)

## 2022-11-09 LAB — CBC WITH DIFFERENTIAL (CANCER CENTER ONLY)
Abs Immature Granulocytes: 0.02 10*3/uL (ref 0.00–0.07)
Basophils Absolute: 0 10*3/uL (ref 0.0–0.1)
Basophils Relative: 1 %
Eosinophils Absolute: 0.2 10*3/uL (ref 0.0–0.5)
Eosinophils Relative: 4 %
HCT: 46.5 % (ref 39.0–52.0)
Hemoglobin: 16.5 g/dL (ref 13.0–17.0)
Immature Granulocytes: 0 %
Lymphocytes Relative: 25 %
Lymphs Abs: 1.5 10*3/uL (ref 0.7–4.0)
MCH: 31.4 pg (ref 26.0–34.0)
MCHC: 35.5 g/dL (ref 30.0–36.0)
MCV: 88.4 fL (ref 80.0–100.0)
Monocytes Absolute: 0.7 10*3/uL (ref 0.1–1.0)
Monocytes Relative: 11 %
Neutro Abs: 3.7 10*3/uL (ref 1.7–7.7)
Neutrophils Relative %: 59 %
Platelet Count: 169 10*3/uL (ref 150–400)
RBC: 5.26 MIL/uL (ref 4.22–5.81)
RDW: 13 % (ref 11.5–15.5)
WBC Count: 6.2 10*3/uL (ref 4.0–10.5)
nRBC: 0 % (ref 0.0–0.2)

## 2022-11-09 LAB — FERRITIN: Ferritin: 28 ng/mL (ref 24–336)

## 2022-11-09 NOTE — Patient Instructions (Signed)

## 2022-11-09 NOTE — Progress Notes (Signed)
Gary Carpenter presents today for phlebotomy per MD orders. Phlebotomy procedure started at 0825 and ended at 0833. 500 grams removed. Patient observed for 30 minutes after procedure without any incident. Patient tolerated procedure well. IV needle removed intact.

## 2022-11-09 NOTE — Progress Notes (Signed)
BP elevated x 2 readings, instructed to continue to monitor at home and notifiy PCP if elevated over 140/90. Verbalilzed understanding.

## 2022-11-09 NOTE — Progress Notes (Signed)
Hematology and Oncology Follow Up Visit  Gary Carpenter [id number] Sep 14, 1940 82 y.o. 11/09/2022   Principle Diagnosis:  Spurious polycythemia  Current Therapy:   Phlebotomy to maintain hematocrit less than 45% Aspirin 81 mg by mouth daily     Interim History:  Mr.  Gary Carpenter is back for followup.  We see him every 6 months.  He still is bothered by the sciatica.  He just does not want him have any injections or surgery for this.  He still trying to work it out.  He had a wonderful surprise birthday a couple weeks ago.  I am so happy that his family was able to pull this off.  He has been working.  He only seems to work.  He enjoys working.  He is doing well on the Ozempic.  His blood sugars seem to be under good control.  His blood pressure is on the high side.  This does seem to happen when he comes to the office.  When we last saw him, his ferritin was 32 with an iron saturation of 28%.  He has had no problems with fever.  He has had no issues with COVID.  There is no problems with bowels or bladder.  For some reason, he was taken off the baby aspirin.  This may be because of reflux.  He says the reflux is a lot better.  I told him that the baby aspirin clearly will be advantageous for him with respect to preventing cerebrovascular issues.  Hopefully he will restart taking this.  Currently, his performance status is ECOG 0.  Medications:  Current Outpatient Medications:    fish oil-omega-3 fatty acids 1000 MG capsule, Take 1 g by mouth daily.  , Disp: , Rfl:    FREESTYLE LITE test strip, U TO CHECK ONCE A DAY UTD, Disp: , Rfl: 3   hydrochlorothiazide (HYDRODIURIL) 25 MG tablet, Take 1 tablet (25 mg total) by mouth in the morning., Disp: 90 tablet, Rfl: 3   olmesartan (BENICAR) 40 MG tablet, Take 1 tablet (40 mg total) by mouth daily Replaces losartan., Disp: 90 tablet, Rfl: 3   rosuvastatin (CRESTOR) 10 MG tablet, Take 1 tablet (10 mg total) by mouth every Monday, Wednesday,  and Friday., Disp: 90 tablet, Rfl: 3   Semaglutide, 1 MG/DOSE, (OZEMPIC, 1 MG/DOSE,) 4 MG/3ML SOPN, Inject 1 mg into the skin once a week., Disp: 9 mL, Rfl: 3   tadalafil (CIALIS) 10 MG tablet, Take 10 mg by mouth daily as needed., Disp: , Rfl:   Allergies:  Allergies  Allergen Reactions   Lidocaine     "made me loopy"   Atorvastatin Other (See Comments)    Myalgias and muscle weakness   Rosuvastatin Other (See Comments)    At high doses -> notes myalgias and muscle weakness.   Simvastatin Other (See Comments)    Muscle weakness    Past Medical History, Surgical history, Social history, and Family History were reviewed and updated.  Review of Systems: Review of Systems  Constitutional: Negative.   HENT: Negative.    Eyes: Negative.   Respiratory: Negative.    Cardiovascular: Negative.   Gastrointestinal: Negative.   Genitourinary: Negative.   Musculoskeletal: Negative.   Skin: Negative.   Neurological: Negative.   Endo/Heme/Allergies: Negative.   Psychiatric/Behavioral: Negative.       Physical Exam:  height is 6\' 1"  (1.854 m) and weight is 216 lb 1.9 oz (98 kg).  His temperature is 97.8.  Pulse is 70.  Blood pressure 170/92.  Physical Exam Vitals reviewed.  HENT:     Head: Normocephalic and atraumatic.  Eyes:     Pupils: Pupils are equal, round, and reactive to light.  Cardiovascular:     Rate and Rhythm: Normal rate and regular rhythm.     Heart sounds: Normal heart sounds.  Pulmonary:     Effort: Pulmonary effort is normal.     Breath sounds: Normal breath sounds.  Abdominal:     General: Bowel sounds are normal.     Palpations: Abdomen is soft.  Musculoskeletal:        General: No tenderness or deformity. Normal range of motion.     Cervical back: Normal range of motion.  Lymphadenopathy:     Cervical: No cervical adenopathy.  Skin:    General: Skin is warm and dry.     Findings: No erythema or rash.  Neurological:     Mental Status: He is alert and  oriented to person, place, and time.  Psychiatric:        Behavior: Behavior normal.        Thought Content: Thought content normal.        Judgment: Judgment normal.      Lab Results  Component Value Date   WBC 6.5 05/11/2022   HGB 16.9 05/11/2022   HCT 47.7 05/11/2022   MCV 89.2 05/11/2022   PLT 178 05/11/2022     Chemistry      Component Value Date/Time   NA 139 05/11/2022 0746   NA 143 03/29/2017 0856   NA 137 03/28/2016 0841   K 4.5 05/11/2022 0746   K 4.0 03/29/2017 0856   K 4.2 03/28/2016 0841   CL 100 05/11/2022 0746   CL 101 03/29/2017 0856   CO2 31 05/11/2022 0746   CO2 30 03/29/2017 0856   CO2 25 03/28/2016 0841   BUN 16 05/11/2022 0746   BUN 14 03/29/2017 0856   BUN 16.4 03/28/2016 0841   CREATININE 1.09 05/11/2022 0746   CREATININE 0.8 03/29/2017 0856   CREATININE 1.0 03/28/2016 0841      Component Value Date/Time   CALCIUM 10.6 (H) 05/11/2022 0746   CALCIUM 9.7 03/29/2017 0856   CALCIUM 9.7 03/28/2016 0841   ALKPHOS 63 05/11/2022 0746   ALKPHOS 86 (H) 03/29/2017 0856   ALKPHOS 78 03/28/2016 0841   AST 20 05/11/2022 0746   AST 23 03/28/2016 0841   ALT 21 05/11/2022 0746   ALT 41 03/29/2017 0856   ALT 34 03/28/2016 0841   BILITOT 0.8 05/11/2022 0746   BILITOT 1.14 03/28/2016 0841      Impression and Plan:  Mr. Closser is a 82 year old gentleman. He has spurious polycythemia. However, we are keeping his hematocrit below 45%. This does make him feel better.  I will go ahead and phlebotomize him today.    As always, he will continue to stay incredibly busy.  Hopefully, the sciatica will not slow him down.  It sounds like he is going try to go to [place] in October.  As always, we will see him back in 6 months.    6/26/20248:04 AM

## 2022-12-07 DIAGNOSIS — I10 Essential (primary) hypertension: Secondary | ICD-10-CM | POA: Diagnosis not present

## 2022-12-07 LAB — LAB REPORT - SCANNED: EGFR: 87

## 2022-12-12 ENCOUNTER — Other Ambulatory Visit (HOSPITAL_COMMUNITY): Payer: Self-pay

## 2022-12-12 DIAGNOSIS — Z23 Encounter for immunization: Secondary | ICD-10-CM | POA: Diagnosis not present

## 2022-12-12 DIAGNOSIS — I10 Essential (primary) hypertension: Secondary | ICD-10-CM | POA: Diagnosis not present

## 2022-12-12 DIAGNOSIS — I2510 Atherosclerotic heart disease of native coronary artery without angina pectoris: Secondary | ICD-10-CM | POA: Diagnosis not present

## 2022-12-12 DIAGNOSIS — N529 Male erectile dysfunction, unspecified: Secondary | ICD-10-CM | POA: Diagnosis not present

## 2022-12-12 DIAGNOSIS — D751 Secondary polycythemia: Secondary | ICD-10-CM | POA: Diagnosis not present

## 2022-12-12 DIAGNOSIS — J309 Allergic rhinitis, unspecified: Secondary | ICD-10-CM | POA: Diagnosis not present

## 2022-12-12 DIAGNOSIS — E785 Hyperlipidemia, unspecified: Secondary | ICD-10-CM | POA: Diagnosis not present

## 2022-12-12 DIAGNOSIS — E118 Type 2 diabetes mellitus with unspecified complications: Secondary | ICD-10-CM | POA: Diagnosis not present

## 2022-12-12 DIAGNOSIS — Z0000 Encounter for general adult medical examination without abnormal findings: Secondary | ICD-10-CM | POA: Diagnosis not present

## 2023-01-13 ENCOUNTER — Other Ambulatory Visit (HOSPITAL_COMMUNITY): Payer: Self-pay

## 2023-01-17 ENCOUNTER — Other Ambulatory Visit (HOSPITAL_COMMUNITY): Payer: Self-pay

## 2023-02-24 DIAGNOSIS — M65332 Trigger finger, left middle finger: Secondary | ICD-10-CM | POA: Diagnosis not present

## 2023-03-06 ENCOUNTER — Other Ambulatory Visit (HOSPITAL_COMMUNITY): Payer: Self-pay

## 2023-03-06 ENCOUNTER — Other Ambulatory Visit: Payer: Self-pay

## 2023-03-14 DIAGNOSIS — E785 Hyperlipidemia, unspecified: Secondary | ICD-10-CM | POA: Diagnosis not present

## 2023-03-14 DIAGNOSIS — I2510 Atherosclerotic heart disease of native coronary artery without angina pectoris: Secondary | ICD-10-CM | POA: Diagnosis not present

## 2023-03-14 DIAGNOSIS — E1169 Type 2 diabetes mellitus with other specified complication: Secondary | ICD-10-CM | POA: Diagnosis not present

## 2023-03-14 DIAGNOSIS — I10 Essential (primary) hypertension: Secondary | ICD-10-CM | POA: Diagnosis not present

## 2023-03-20 ENCOUNTER — Other Ambulatory Visit (HOSPITAL_COMMUNITY): Payer: Self-pay

## 2023-03-20 MED ORDER — AMOXICILLIN 500 MG PO CAPS
1000.0000 mg | ORAL_CAPSULE | ORAL | 1 refills | Status: DC
  Filled 2023-03-20: qty 30, 9d supply, fill #0

## 2023-03-24 DIAGNOSIS — M65332 Trigger finger, left middle finger: Secondary | ICD-10-CM | POA: Diagnosis not present

## 2023-03-29 ENCOUNTER — Encounter: Payer: Self-pay | Admitting: Family

## 2023-03-31 ENCOUNTER — Other Ambulatory Visit (HOSPITAL_COMMUNITY): Payer: Self-pay

## 2023-03-31 ENCOUNTER — Encounter: Payer: Self-pay | Admitting: Gastroenterology

## 2023-03-31 ENCOUNTER — Ambulatory Visit (INDEPENDENT_AMBULATORY_CARE_PROVIDER_SITE_OTHER): Payer: Medicare Other | Admitting: Gastroenterology

## 2023-03-31 VITALS — BP 110/80 | HR 101 | Ht 73.0 in | Wt 214.0 lb

## 2023-03-31 DIAGNOSIS — Z860100 Personal history of colon polyps, unspecified: Secondary | ICD-10-CM | POA: Diagnosis not present

## 2023-03-31 DIAGNOSIS — K5900 Constipation, unspecified: Secondary | ICD-10-CM

## 2023-03-31 DIAGNOSIS — R011 Cardiac murmur, unspecified: Secondary | ICD-10-CM | POA: Diagnosis not present

## 2023-03-31 DIAGNOSIS — K219 Gastro-esophageal reflux disease without esophagitis: Secondary | ICD-10-CM | POA: Diagnosis not present

## 2023-03-31 MED ORDER — FAMOTIDINE 20 MG PO TABS
20.0000 mg | ORAL_TABLET | Freq: Two times a day (BID) | ORAL | 3 refills | Status: DC
  Filled 2023-03-31: qty 30, 15d supply, fill #0

## 2023-03-31 NOTE — Patient Instructions (Addendum)
We have sent the following medications to your pharmacy for you to pick up at your convenience:  Pepcid   VISIT SUMMARY:  During today's visit, we discussed your ongoing symptoms, including the burning sensation in your chest and excessive salivation. We reviewed your history of peptic and duodenal ulcers, as well as your current medications and other health concerns. Based on your symptoms and history, we have made some adjustments to your treatment plan and provided recommendations to help manage your condition.  YOUR PLAN:  -GASTROESOPHAGEAL REFLUX DISEASE (GERD): GERD is a condition where stomach acid frequently flows back into the tube connecting your mouth and stomach, causing irritation. To manage this, start taking Pepcid 20mg  as needed for symptoms. You will also receive a handout with lifestyle modifications that can help reduce symptoms. If Pepcid does not help, we may try a 30-day trial of pantoprazole. If your symptoms continue despite these measures, we might need to perform an endoscopy. Please follow up in 3 months or sooner if your symptoms worsen, and provide an update via MyChart in 1 month.   INSTRUCTIONS:  Please follow up in 3 months or sooner if your symptoms worsen. Additionally, provide an update via MyChart in 1 month.

## 2023-03-31 NOTE — Progress Notes (Signed)
Chief Complaint: GERD Primary GI MD: Dr. Rhea Belton  HPI: 82 year old male with medical history as listed below presents for evaluation of GERD.  Discussed the use of AI scribe software for clinical note transcription with the patient, who gave verbal consent to proceed.  History of Present Illness   The patient presents with a burning sensation in the chest. The discomfort, which has been ongoing for about a year, occurs a couple of times a week and is not consistently associated with eating or drinking. He denies dysphagia, abdominal pain, nausea, and vomiting.   He reports constipation, which he attributes to his Ozempic medication, but notes that this symptom is improving. The patient has a history of allergies and experiences frequent throat clearing due to postnasal drip. He has a remote history of smoking, but quit in the 1980s.   States when he was in college he took aspirin daily and it resulted in a peptic ulcer. States this does not feel similar to that.    He denies weight loss, melena, hematochezia. Is not interested in getting a repeat colonoscopy.  Recent CBC/CMP unrevealing  PREVIOUS GI WORKUP   Colonoscopy 02/12/2020 - One 5 mm polyp at the ileocecal valve, removed with a cold snare. Resected and retrieved.  - One 5 mm polyp in the ascending colon, removed with a cold snare. Resected and retrieved.  - One 4 mm polyp in the transverse colon, removed with a cold snare. Resected and retrieved.  - Two 3 to 4 mm polyps in the descending colon, removed with a cold snare. Resected and retrieved.  - Diverticulosis in the sigmoid colon.  - Internal hemorrhoids. - possible repeat 3-5 years (if patient of adequate health status secondary to age)  Past Medical History:  Diagnosis Date   Allergy    seasonal allergies   Cataract 2018   sx to correct-bilateral   Diabetes mellitus without complication (HCC)    on meds   GERD (gastroesophageal reflux disease)    hx of   History  of peptic ulcer disease    Thought related to aspirin use.   History of rheumatic fever as a child 7   Has never been diagnosed with cardiac issues   Hyperlipidemia    on meds; takes low-dose rosuvastatin 5 mg 3 days a week -> recently started   Hypertension    on meds   Hypertriglyceridemia    Polycythemia vera, acquired (HCC)    Intermittent phlebotomy    Past Surgical History:  Procedure Laterality Date   CATARACT EXTRACTION, BILATERAL Bilateral 2018   CERVICAL SPINE SURGERY  2003   2 disc replaced   COLONOSCOPY  2011   Gessner-moviprep-exc-F/V=HPP   HEMORRHOID SURGERY  1976   HERNIA REPAIR Right 1975   MENISCUS REPAIR Bilateral    TONSILLECTOMY     by natural causes- "they rotted out when I was younger"   WISDOM TOOTH EXTRACTION      Current Outpatient Medications  Medication Sig Dispense Refill   famotidine (PEPCID) 20 MG tablet Take 1 tablet (20 mg total) by mouth 2 (two) times daily. 30 tablet 3   fish oil-omega-3 fatty acids 1000 MG capsule Take 1 g by mouth daily.       FREESTYLE LITE test strip U TO CHECK ONCE A DAY UTD  3   hydrochlorothiazide (HYDRODIURIL) 25 MG tablet Take 1 tablet (25 mg total) by mouth in the morning. 90 tablet 3   olmesartan (BENICAR) 40 MG tablet Take 1 tablet (40  mg total) by mouth daily Replaces losartan. 90 tablet 3   rosuvastatin (CRESTOR) 10 MG tablet Take 1 tablet (10 mg total) by mouth every Monday, Wednesday, and Friday. 90 tablet 3   Semaglutide, 1 MG/DOSE, (OZEMPIC, 1 MG/DOSE,) 4 MG/3ML SOPN Inject 1 mg into the skin once a week. 9 mL 3   tadalafil (CIALIS) 10 MG tablet Take 10 mg by mouth daily as needed.     No current facility-administered medications for this visit.    Allergies as of 03/31/2023 - Review Complete 03/31/2023  Allergen Reaction Noted   Lidocaine  09/27/2017   Atorvastatin Other (See Comments) 02/20/2022   Rosuvastatin Other (See Comments) 02/20/2022   Simvastatin Other (See Comments) 02/20/2022     Family History  Problem Relation Age of Onset   Lung cancer Mother 45   Bone cancer Brother 25   Colon polyps Neg Hx    Colon cancer Neg Hx    Esophageal cancer Neg Hx    Stomach cancer Neg Hx    Rectal cancer Neg Hx    Coronary artery disease Neg Hx    Heart attack Neg Hx    Cardiomyopathy Neg Hx     Social History   Socioeconomic History   Marital status: Divorced    Spouse name: Not on file   Number of children: 4   Years of education: 16   Highest education level: Not on file  Occupational History   Not on file  Tobacco Use   Smoking status: Former    Current packs/day: 0.00    Average packs/day: 2.0 packs/day for 27.3 years (54.5 ttl pk-yrs)    Types: Cigarettes    Start date: 01/12/1957    Quit date: 04/14/1984    Years since quitting: 38.9   Smokeless tobacco: Never   Tobacco comments:    quit 30 years ago  Vaping Use   Vaping status: Never Used  Substance and Sexual Activity   Alcohol use: Yes    Alcohol/week: 14.0 standard drinks of alcohol    Types: 14 Glasses of wine per week   Drug use: Never   Sexual activity: Yes  Other Topics Concern   Not on file  Social History Narrative   He is currently divorced, but actively dating.   Father of 4: 2 sons and 2 daughters.;  8 grandchildren   Lives with a girlfriend   He does have children from his first wife.      Indicates that he is very active:   He previously used to walk routinely at least 2 miles a day up until summer 2023   Still plays golf 2 to 3 days a week.  Walks most the way totals close to 4-5 miles.      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   Social Determinants of Health   Financial Resource Strain: Not on file  Food Insecurity: Not on file  Transportation Needs: Not on file  Physical Activity: Not on file  Stress: Not on file  Social Connections: Not on file  Intimate Partner Violence: Not on file    Review of Systems:     Constitutional: No weight loss, fever, chills, weakness or fatigue HEENT: Eyes: No change in vision               Ears, Nose, Throat:  No change in hearing or congestion Skin: No rash or itching Cardiovascular: No chest pain, chest pressure or palpitations  Respiratory: No SOB or cough Gastrointestinal: See HPI and otherwise negative Genitourinary: No dysuria or change in urinary frequency Neurological: No headache, dizziness or syncope Musculoskeletal: No new muscle or joint pain Hematologic: No bleeding or bruising Psychiatric: No history of depression or anxiety    Physical Exam:  Vital signs: BP 110/80   Pulse (!) 101   Ht 6\' 1"  (1.854 m)   Wt 214 lb (97.1 kg)   BMI 28.23 kg/m   Constitutional: NAD, Well developed, Well nourished, alert and cooperative Head:  Normocephalic and atraumatic. Eyes:   PEERL, EOMI. No icterus. Conjunctiva pink. Respiratory: Respirations even and unlabored. Lungs clear to auscultation bilaterally.   No wheezes, crackles, or rhonchi.  Cardiovascular:  Regular rate and rhythm. Grade IV murmur. No peripheral edema, cyanosis or pallor.  Gastrointestinal:  Soft, nondistended, nontender. No rebound or guarding. Normal bowel sounds. No appreciable masses or hepatomegaly. Rectal:  Not performed.  Msk:  Symmetrical without gross deformities. Without edema, no deformity or joint abnormality.  Neurologic:  Alert and  oriented x4;  grossly normal neurologically.  Skin:   Dry and intact without significant lesions or rashes. Psychiatric: Oriented to person, place and time. Demonstrates good judgement and reason without abnormal affect or behaviors.   RELEVANT LABS AND IMAGING: CBC    Component Value Date/Time   WBC 6.2 11/09/2022 0746   RBC 5.26 11/09/2022 0746   RBC 5.24 11/09/2022 0746   HGB 16.5 11/09/2022 0746   HGB 17.2 (H) 03/29/2017 0856   HCT 46.5 11/09/2022 0746   HCT 47.2 03/29/2017 0856   PLT 169 11/09/2022 0746   PLT 159 03/29/2017 0856    MCV 88.4 11/09/2022 0746   MCV 88 03/29/2017 0856   MCH 31.4 11/09/2022 0746   MCHC 35.5 11/09/2022 0746   RDW 13.0 11/09/2022 0746   RDW 12.7 03/29/2017 0856   LYMPHSABS 1.5 11/09/2022 0746   LYMPHSABS 1.4 03/29/2017 0856   MONOABS 0.7 11/09/2022 0746   EOSABS 0.2 11/09/2022 0746   EOSABS 0.2 03/29/2017 0856   BASOSABS 0.0 11/09/2022 0746   BASOSABS 0.0 03/29/2017 0856    CMP     Component Value Date/Time   NA 136 11/09/2022 0746   NA 143 03/29/2017 0856   NA 137 03/28/2016 0841   K 4.3 11/09/2022 0746   K 4.0 03/29/2017 0856   K 4.2 03/28/2016 0841   CL 102 11/09/2022 0746   CL 101 03/29/2017 0856   CO2 28 11/09/2022 0746   CO2 30 03/29/2017 0856   CO2 25 03/28/2016 0841   GLUCOSE 166 (H) 11/09/2022 0746   GLUCOSE 250 (H) 03/29/2017 0856   BUN 14 11/09/2022 0746   BUN 14 03/29/2017 0856   BUN 16.4 03/28/2016 0841   CREATININE 0.94 11/09/2022 0746   CREATININE 0.8 03/29/2017 0856   CREATININE 1.0 03/28/2016 0841   CALCIUM 10.0 11/09/2022 0746   CALCIUM 9.7 03/29/2017 0856   CALCIUM 9.7 03/28/2016 0841   PROT 6.6 11/09/2022 0746   PROT 7.0 03/29/2017 0856   PROT 7.3 03/28/2016 0841   ALBUMIN 4.5 11/09/2022 0746   ALBUMIN 3.7 03/29/2017 0856   ALBUMIN 3.7 03/28/2016 0841   AST 20 11/09/2022 0746   AST 23 03/28/2016 0841   ALT 18 11/09/2022 0746   ALT 41 03/29/2017 0856   ALT 34 03/28/2016 0841   ALKPHOS 63 11/09/2022 0746   ALKPHOS 86 (H) 03/29/2017 0856   ALKPHOS 78 03/28/2016 0841   BILITOT 0.6 11/09/2022 0746   BILITOT 1.14  03/28/2016 0841   GFRNONAA >60 11/09/2022 0746   GFRAA >60 09/25/2019 0912     Assessment/Plan:      Gastroesophageal Reflux Disease (GERD) Reports of burning sensation in chest, not always associated with eating or drinking. Rare nighttime symptoms relived with tums. No dysphagia, abdominal pain, nausea, vomiting, or changes in bowel habits. History of peptic ulcer (NSAID induced) in his 68s. Currently on ozempic likely  worsening his symptoms. Patient prefers "as needed" medicine rather than daily. --Start Pepcid 20mg  as needed for symptoms. --Provide GERD lifestyle modification handout. --If Pepcid is ineffective, consider a 30-day trial of pantoprazole. --If symptoms persist despite medication and lifestyle modifications, consider endoscopy. --Follow-up in 3 months or sooner if symptoms worsen. Patient to provide MyChart update in 1 month.  Constipation Reports of constipation associated with Ozempic use, but improving. -if worsening, can use miralax prn  History of Rheumatic Fever Noted heart murmur, known history of rheumatic fever when he was 16.  CRC screening Last colonoscopy in 2021 with several tubular adenomas. Repeat was left up to patient decision due to age. Patient does not want to proceed with additional colonoscopies at this time - if change in bowel habits, rectal bleeding, other red flag symptoms we can discuss repeating colonoscopy     Lara Mulch [facility] 03/31/2023, 10:00 AM  Cc: Merri Brunette, MD

## 2023-04-07 NOTE — Progress Notes (Signed)
Addendum: Reviewed and agree with assessment and management plan. Kadijah Shamoon M, MD  

## 2023-05-25 ENCOUNTER — Other Ambulatory Visit (HOSPITAL_BASED_OUTPATIENT_CLINIC_OR_DEPARTMENT_OTHER): Payer: Self-pay

## 2023-05-26 ENCOUNTER — Other Ambulatory Visit: Payer: Self-pay

## 2023-05-26 ENCOUNTER — Other Ambulatory Visit (HOSPITAL_COMMUNITY): Payer: Self-pay

## 2023-05-30 ENCOUNTER — Encounter (HOSPITAL_COMMUNITY): Payer: Self-pay

## 2023-05-30 ENCOUNTER — Other Ambulatory Visit (HOSPITAL_COMMUNITY): Payer: Self-pay

## 2023-06-01 ENCOUNTER — Other Ambulatory Visit (HOSPITAL_COMMUNITY): Payer: Self-pay

## 2023-06-07 ENCOUNTER — Encounter: Payer: Self-pay | Admitting: Family

## 2023-06-08 ENCOUNTER — Encounter: Payer: Self-pay | Admitting: Family

## 2023-06-13 DIAGNOSIS — E119 Type 2 diabetes mellitus without complications: Secondary | ICD-10-CM | POA: Diagnosis not present

## 2023-06-20 DIAGNOSIS — I2510 Atherosclerotic heart disease of native coronary artery without angina pectoris: Secondary | ICD-10-CM | POA: Diagnosis not present

## 2023-06-20 DIAGNOSIS — E785 Hyperlipidemia, unspecified: Secondary | ICD-10-CM | POA: Diagnosis not present

## 2023-06-20 DIAGNOSIS — E1169 Type 2 diabetes mellitus with other specified complication: Secondary | ICD-10-CM | POA: Diagnosis not present

## 2023-06-20 DIAGNOSIS — I10 Essential (primary) hypertension: Secondary | ICD-10-CM | POA: Diagnosis not present

## 2023-07-04 ENCOUNTER — Ambulatory Visit: Payer: Medicare Other | Admitting: Gastroenterology

## 2023-07-05 ENCOUNTER — Ambulatory Visit (INDEPENDENT_AMBULATORY_CARE_PROVIDER_SITE_OTHER): Payer: Medicare Other | Admitting: Gastroenterology

## 2023-07-05 ENCOUNTER — Encounter: Payer: Self-pay | Admitting: Gastroenterology

## 2023-07-05 VITALS — BP 144/88 | HR 72 | Ht 73.0 in | Wt 219.0 lb

## 2023-07-05 DIAGNOSIS — Z860101 Personal history of adenomatous and serrated colon polyps: Secondary | ICD-10-CM

## 2023-07-05 DIAGNOSIS — K219 Gastro-esophageal reflux disease without esophagitis: Secondary | ICD-10-CM | POA: Diagnosis not present

## 2023-07-05 DIAGNOSIS — R1110 Vomiting, unspecified: Secondary | ICD-10-CM

## 2023-07-05 NOTE — Progress Notes (Signed)
 Chief Complaint: GERD follow up Primary GI MD: Dr. Rhea Belton  HPI: Discussed the use of AI scribe software for clinical note transcription with the patient, who gave verbal consent to proceed.  History of Present Illness   Gary Carpenter is an 83 year old male with a history of reflux who presents with recurrent reflux symptoms.  He experiences recurrent reflux symptoms, with a notable episode about a week ago while eating beef and rice. During this episode, he felt as if something was going to come up but it did not. Instead, he experienced a sensation of water draining from his mouth, which was not clear but also not colored. This episode was heavy but not food-related.  He has a history of reflux and has experienced similar symptoms over the years, including acid contents coming up at night, necessitating sleeping with pillows. He has tried Pepcid (famotidine) as needed, which was effective for about four days before losing efficacy. He has a history of ulcers from college and is familiar with the sensation of 'getting ready to blow your guts out.'  He mentions frequent throat clearing and wonders if his symptoms could be related to allergies, as he experiences a lot of drainage. He has attempted dietary changes, such as eliminating pepper, without improvement. Drinking water sometimes helps alleviate symptoms, but not consistently.  He has a significant past medical history of arthritic rheumatic fever as a teenager, which led to knee swelling and a heart murmur. He took large amounts of aspirin during that time, which he believes contributed to his ulcer history. He currently takes aspirin, which he acknowledges can cause ulcers.  No overeating or frequent consumption of fried foods and soft drinks.      PREVIOUS GI WORKUP   Colonoscopy 02/12/2020 - One 5 mm polyp at the ileocecal valve, removed with a cold snare. Resected and retrieved.  - One 5 mm polyp in the ascending colon,  removed with a cold snare. Resected and retrieved.  - One 4 mm polyp in the transverse colon, removed with a cold snare. Resected and retrieved.  - Two 3 to 4 mm polyps in the descending colon, removed with a cold snare. Resected and retrieved.  - Diverticulosis in the sigmoid colon.  - Internal hemorrhoids. - possible repeat 3-5 years (if patient of adequate health status secondary to age)  Past Medical History:  Diagnosis Date   Allergy    seasonal allergies   Cataract 2018   sx to correct-bilateral   Diabetes mellitus without complication (HCC)    on meds   GERD (gastroesophageal reflux disease)    hx of   History of peptic ulcer disease    Thought related to aspirin use.   History of rheumatic fever as a child 7   Has never been diagnosed with cardiac issues   Hyperlipidemia    on meds; takes low-dose rosuvastatin 5 mg 3 days a week -> recently started   Hypertension    on meds   Hypertriglyceridemia    Polycythemia vera, acquired (HCC)    Intermittent phlebotomy    Past Surgical History:  Procedure Laterality Date   CATARACT EXTRACTION, BILATERAL Bilateral 2018   CERVICAL SPINE SURGERY  2003   2 disc replaced   COLONOSCOPY  2011   Gessner-moviprep-exc-F/V=HPP   HEMORRHOID SURGERY  1976   HERNIA REPAIR Right 1975   MENISCUS REPAIR Bilateral    TONSILLECTOMY     by natural causes- "they rotted out when I was younger"  WISDOM TOOTH EXTRACTION      Current Outpatient Medications  Medication Sig Dispense Refill   fish oil-omega-3 fatty acids 1000 MG capsule Take 1 g by mouth daily.       FREESTYLE LITE test strip U TO CHECK ONCE A DAY UTD  3   hydrochlorothiazide (HYDRODIURIL) 25 MG tablet Take 1 tablet (25 mg total) by mouth in the morning. 90 tablet 3   olmesartan (BENICAR) 40 MG tablet Take 1 tablet (40 mg total) by mouth daily Replaces losartan. 90 tablet 3   rosuvastatin (CRESTOR) 10 MG tablet Take 1 tablet (10 mg total) by mouth every Monday, Wednesday,  and Friday. 90 tablet 3   Semaglutide, 1 MG/DOSE, (OZEMPIC, 1 MG/DOSE,) 4 MG/3ML SOPN Inject 1 mg into the skin once a week. 9 mL 3   tadalafil (CIALIS) 10 MG tablet Take 10 mg by mouth daily as needed.     famotidine (PEPCID) 20 MG tablet Take 1 tablet (20 mg total) by mouth 2 (two) times daily. 30 tablet 3   No current facility-administered medications for this visit.    Allergies as of 07/05/2023 - Review Complete 07/05/2023  Allergen Reaction Noted   Lidocaine  09/27/2017   Atorvastatin Other (See Comments) 02/20/2022   Rosuvastatin Other (See Comments) 02/20/2022   Simvastatin Other (See Comments) 02/20/2022    Family History  Problem Relation Age of Onset   Lung cancer Mother 48   Bone cancer Brother 59   Colon polyps Neg Hx    Colon cancer Neg Hx    Esophageal cancer Neg Hx    Stomach cancer Neg Hx    Rectal cancer Neg Hx    Coronary artery disease Neg Hx    Heart attack Neg Hx    Cardiomyopathy Neg Hx     Social History   Socioeconomic History   Marital status: Divorced    Spouse name: Not on file   Number of children: 4   Years of education: 16   Highest education level: Not on file  Occupational History   Not on file  Tobacco Use   Smoking status: Former    Current packs/day: 0.00    Average packs/day: 2.0 packs/day for 27.3 years (54.5 ttl pk-yrs)    Types: Cigarettes    Start date: 01/12/1957    Quit date: 04/14/1984    Years since quitting: 39.2   Smokeless tobacco: Never   Tobacco comments:    quit 30 years ago  Vaping Use   Vaping status: Never Used  Substance and Sexual Activity   Alcohol use: Yes    Alcohol/week: 14.0 standard drinks of alcohol    Types: 14 Glasses of wine per week   Drug use: Never   Sexual activity: Yes  Other Topics Concern   Not on file  Social History Narrative   He is currently divorced, but actively dating.   Father of 4: 2 sons and 2 daughters.;  8 grandchildren   Lives with a girlfriend   He does have children  from his first wife.      Indicates that he is very active:   He previously used to walk routinely at least 2 miles a day up until summer 2023   Still plays golf 2 to 3 days a week.  Walks most the way totals close to 4-5 miles.      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   Social Drivers of Dispensing optician  Resource Strain: Not on file  Food Insecurity: Not on file  Transportation Needs: Not on file  Physical Activity: Not on file  Stress: Not on file  Social Connections: Not on file  Intimate Partner Violence: Not on file    Review of Systems:    Constitutional: No weight loss, fever, chills, weakness or fatigue HEENT: Eyes: No change in vision               Ears, Nose, Throat:  No change in hearing or congestion Skin: No rash or itching Cardiovascular: No chest pain, chest pressure or palpitations   Respiratory: No SOB or cough Gastrointestinal: See HPI and otherwise negative Genitourinary: No dysuria or change in urinary frequency Neurological: No headache, dizziness or syncope Musculoskeletal: No new muscle or joint pain Hematologic: No bleeding or bruising Psychiatric: No history of depression or anxiety    Physical Exam:  Vital signs: BP (!) 144/88   Pulse 72   Ht 6\' 1"  (1.854 m)   Wt 219 lb (99.3 kg)   BMI 28.89 kg/m   Constitutional: NAD, Well developed, Well nourished, alert and cooperative Head:  Normocephalic and atraumatic. Eyes:   PEERL, EOMI. No icterus. Conjunctiva pink. Respiratory: Respirations even and unlabored. Lungs clear to auscultation bilaterally.   No wheezes, crackles, or rhonchi.  Cardiovascular:  Regular rate and rhythm. No peripheral edema, cyanosis or pallor.  Gastrointestinal:  Soft, nondistended, nontender. No rebound or guarding. Normal bowel sounds. No appreciable masses or hepatomegaly. Rectal:  Not performed.  Msk:  Symmetrical without gross deformities. Without edema, no deformity or  joint abnormality.  Neurologic:  Alert and  oriented x4;  grossly normal neurologically.  Skin:   Dry and intact without significant lesions or rashes. Psychiatric: Oriented to person, place and time. Demonstrates good judgement and reason without abnormal affect or behaviors.   RELEVANT LABS AND IMAGING: CBC    Component Value Date/Time   WBC 6.2 11/09/2022 0746   RBC 5.26 11/09/2022 0746   RBC 5.24 11/09/2022 0746   HGB 16.5 11/09/2022 0746   HGB 17.2 (H) 03/29/2017 0856   HCT 46.5 11/09/2022 0746   HCT 47.2 03/29/2017 0856   PLT 169 11/09/2022 0746   PLT 159 03/29/2017 0856   MCV 88.4 11/09/2022 0746   MCV 88 03/29/2017 0856   MCH 31.4 11/09/2022 0746   MCHC 35.5 11/09/2022 0746   RDW 13.0 11/09/2022 0746   RDW 12.7 03/29/2017 0856   LYMPHSABS 1.5 11/09/2022 0746   LYMPHSABS 1.4 03/29/2017 0856   MONOABS 0.7 11/09/2022 0746   EOSABS 0.2 11/09/2022 0746   EOSABS 0.2 03/29/2017 0856   BASOSABS 0.0 11/09/2022 0746   BASOSABS 0.0 03/29/2017 0856    CMP     Component Value Date/Time   NA 136 11/09/2022 0746   NA 143 03/29/2017 0856   NA 137 03/28/2016 0841   K 4.3 11/09/2022 0746   K 4.0 03/29/2017 0856   K 4.2 03/28/2016 0841   CL 102 11/09/2022 0746   CL 101 03/29/2017 0856   CO2 28 11/09/2022 0746   CO2 30 03/29/2017 0856   CO2 25 03/28/2016 0841   GLUCOSE 166 (H) 11/09/2022 0746   GLUCOSE 250 (H) 03/29/2017 0856   BUN 14 11/09/2022 0746   BUN 14 03/29/2017 0856   BUN 16.4 03/28/2016 0841   CREATININE 0.94 11/09/2022 0746   CREATININE 0.8 03/29/2017 0856   CREATININE 1.0 03/28/2016 0841   CALCIUM 10.0 11/09/2022 0746   CALCIUM 9.7 03/29/2017 0856  CALCIUM 9.7 03/28/2016 0841   PROT 6.6 11/09/2022 0746   PROT 7.0 03/29/2017 0856   PROT 7.3 03/28/2016 0841   ALBUMIN 4.5 11/09/2022 0746   ALBUMIN 3.7 03/29/2017 0856   ALBUMIN 3.7 03/28/2016 0841   AST 20 11/09/2022 0746   AST 23 03/28/2016 0841   ALT 18 11/09/2022 0746   ALT 41 03/29/2017 0856   ALT  34 03/28/2016 0841   ALKPHOS 63 11/09/2022 0746   ALKPHOS 86 (H) 03/29/2017 0856   ALKPHOS 78 03/28/2016 0841   BILITOT 0.6 11/09/2022 0746   BILITOT 1.14 03/28/2016 0841   GFRNONAA >60 11/09/2022 0746   GFRAA >60 09/25/2019 0912     Assessment/Plan:      GERD Regurgitation Recurrent symptoms of reflux, including regurgitation and throat clearing. Previous trial of Pepcid was not effective. Patient has a history of ulcers.  Longstanding history of aspirin use.  He is not interested in taking daily medication such as pantoprazole or Pepcid.  Suspect being on Ozempic is likely worsening his symptoms -- EGD for evaluation of esophagitis, gastritis, PUD - I thoroughly discussed the procedure with the patient (at bedside) to include nature of the procedure, alternatives, benefits, and risks (including but not limited to bleeding, infection, perforation, anesthesia/cardiac pulmonary complications).  Patient verbalized understanding and gave verbal consent to proceed with procedure. - Management per EGD findings   CRC screening Last colonoscopy in 2021 with several tubular adenomas. Repeat was left up to patient decision due to age. Patient does not want to proceed with additional colonoscopies at this time - if change in bowel habits, rectal bleeding, other red flag symptoms we can discuss repeating colonoscopy    [facility] 07/05/2023, 9:45 AM  Cc: Merri Brunette, MD

## 2023-07-05 NOTE — Patient Instructions (Signed)
 You have been scheduled for an endoscopy. Please follow written instructions given to you at your visit today.  If you use inhalers (even only as needed), please bring them with you on the day of your procedure.  If you take any of the following medications, they will need to be adjusted prior to your procedure:   DO NOT TAKE 7 DAYS PRIOR TO TEST- Trulicity (dulaglutide) Ozempic, Wegovy (semaglutide) Mounjaro (tirzepatide) Bydureon Bcise (exanatide extended release)  DO NOT TAKE 1 DAY PRIOR TO YOUR TEST Rybelsus (semaglutide) Adlyxin (lixisenatide) Victoza (liraglutide) Byetta (exanatide) ___________________________________________________________________________   Follow up as needed  Due to recent changes in healthcare laws, you may see the results of your imaging and laboratory studies on MyChart before your provider has had a chance to review them.  We understand that in some cases there may be results that are confusing or concerning to you. Not all laboratory results come back in the same time frame and the provider may be waiting for multiple results in order to interpret others.  Please give [place] 48 hours in order for your provider to thoroughly review all the results before contacting the office for clarification of your results.

## 2023-07-05 NOTE — Progress Notes (Signed)
 Addendum: Reviewed and agree with assessment and management plan. Asha Grumbine, Carie Caddy, MD

## 2023-07-13 ENCOUNTER — Encounter: Payer: Self-pay | Admitting: Family

## 2023-07-19 ENCOUNTER — Other Ambulatory Visit (HOSPITAL_COMMUNITY): Payer: Self-pay

## 2023-07-24 ENCOUNTER — Other Ambulatory Visit (HOSPITAL_COMMUNITY): Payer: Self-pay

## 2023-07-24 ENCOUNTER — Ambulatory Visit (AMBULATORY_SURGERY_CENTER): Payer: Medicare Other | Admitting: Internal Medicine

## 2023-07-24 ENCOUNTER — Encounter: Payer: Self-pay | Admitting: Internal Medicine

## 2023-07-24 VITALS — BP 93/58 | HR 77 | Temp 96.6°F | Resp 16 | Ht 73.0 in | Wt 219.0 lb

## 2023-07-24 DIAGNOSIS — K2100 Gastro-esophageal reflux disease with esophagitis, without bleeding: Secondary | ICD-10-CM

## 2023-07-24 DIAGNOSIS — R1110 Vomiting, unspecified: Secondary | ICD-10-CM

## 2023-07-24 DIAGNOSIS — K209 Esophagitis, unspecified without bleeding: Secondary | ICD-10-CM

## 2023-07-24 DIAGNOSIS — K219 Gastro-esophageal reflux disease without esophagitis: Secondary | ICD-10-CM

## 2023-07-24 MED ORDER — PANTOPRAZOLE SODIUM 40 MG PO TBEC
40.0000 mg | DELAYED_RELEASE_TABLET | Freq: Every day | ORAL | 1 refills | Status: DC
  Filled 2023-07-24: qty 30, 30d supply, fill #0
  Filled 2023-08-21: qty 30, 30d supply, fill #1

## 2023-07-24 MED ORDER — SODIUM CHLORIDE 0.9 % IV SOLN
500.0000 mL | Freq: Once | INTRAVENOUS | Status: DC
Start: 2023-07-24 — End: 2023-07-24

## 2023-07-24 NOTE — Patient Instructions (Signed)
 Educational handout provided to patient related to ESOPHAGITIS  Resume previous diet  Continue present medications   YOU HAD AN ENDOSCOPIC PROCEDURE TODAY AT THE [facility]:   Refer to the procedure report that was given to you for any specific questions about what was found during the examination.  If the procedure report does not answer your questions, please call your gastroenterologist to clarify.  If you requested that your care partner not be given the details of your procedure findings, then the procedure report has been included in a sealed envelope for you to review at your convenience later.  YOU SHOULD EXPECT: Some feelings of bloating in the abdomen. Passage of more gas than usual.  Walking can help get rid of the air that was put into your GI tract during the procedure and reduce the bloating. If you had a lower endoscopy (such as a colonoscopy or flexible sigmoidoscopy) you may notice spotting of blood in your stool or on the toilet paper. If you underwent a bowel prep for your procedure, you may not have a normal bowel movement for a few days.  Please Note:  You might notice some irritation and congestion in your nose or some drainage.  This is from the oxygen used during your procedure.  There is no need for concern and it should clear up in a day or so.  SYMPTOMS TO REPORT IMMEDIATELY:   Following upper endoscopy (EGD)  Vomiting of blood or coffee ground material  New chest pain or pain under the shoulder blades  Painful or persistently difficult swallowing  New shortness of breath  Fever of 100F or higher  Black, tarry-looking stools  For urgent or emergent issues, a gastroenterologist can be reached at any hour by calling [phone]. Do not use MyChart messaging for urgent concerns.    DIET:  We do recommend a small meal at first, but then you may proceed to your regular diet.  Drink plenty of fluids but you should avoid alcoholic beverages for 24  hours.  ACTIVITY:  You should plan to take it easy for the rest of today and you should NOT DRIVE or use heavy machinery until tomorrow (because of the sedation medicines used during the test).    FOLLOW UP: Our staff will call the number listed on your records the next business day following your procedure.  We will call around 7:15- 8:00 am to check on you and address any questions or concerns that you may have regarding the information given to you following your procedure. If we do not reach you, we will leave a message.     If any biopsies were taken you will be contacted by phone or by letter within the next 1-3 weeks.  Please call us at [phone] if you have not heard about the biopsies in 3 weeks.    SIGNATURES/CONFIDENTIALITY: You and/or your care partner have signed paperwork which will be entered into your electronic medical record.  These signatures attest to the fact that that the information above on your After Visit Summary has been reviewed and is understood.  Full responsibility of the confidentiality of this discharge information lies with you and/or your care-partner.

## 2023-07-24 NOTE — Progress Notes (Signed)
 See office note dated 07/05/2023 for details and current H&P  Patient presenting for upper endoscopy to evaluate GERD and regurgitation symptoms  Patient remains appropriate for [facility] upper endoscopy today.

## 2023-07-24 NOTE — Progress Notes (Signed)
 To pacu, VSS. Report to [email or web address]

## 2023-07-24 NOTE — Op Note (Signed)
 [facility] Patient Name: Gary Carpenter Procedure Date: 07/24/2023 9:26 AM MRN: [id number] Endoscopist: Beverley Fiedler , MD, [id number] Age: 83 Referring MD:  Date of Birth: 1941/02/10 Gender: Male Account #: [id number] Procedure:                Upper GI endoscopy Indications:              Regurgitation Medicines:                Monitored Anesthesia Care Procedure:                Pre-Anesthesia Assessment:                           - Prior to the procedure, a History and Physical                            was performed, and patient medications and                            allergies were reviewed. The patient's tolerance of                            previous anesthesia was also reviewed. The risks                            and benefits of the procedure and the sedation                            options and risks were discussed with the patient.                            All questions were answered, and informed consent                            was obtained. Prior Anticoagulants: The patient has                            taken no anticoagulant or antiplatelet agents. ASA                            Grade Assessment: II - A patient with mild systemic                            disease. After reviewing the risks and benefits,                            the patient was deemed in satisfactory condition to                            undergo the procedure.                           After obtaining informed consent, the endoscope was  passed under direct vision. Throughout the                            procedure, the patient's blood pressure, pulse, and                            oxygen saturations were monitored continuously. The                            GIF [id number] #[id number] was introduced through the                            mouth, and advanced to the second part of duodenum.                            The upper GI endoscopy was accomplished  without                            difficulty. The patient tolerated the procedure                            well. Scope In: Scope Out: Findings:                 LA Grade B (one or more mucosal breaks greater than                            5 mm, not extending between the tops of two mucosal                            folds) esophagitis was found at the                            gastroesophageal junction.                           The entire examined stomach was normal.                           The examined duodenum was normal. Complications:            No immediate complications. Estimated Blood Loss:     Estimated blood loss: none. Impression:               - LA Grade B reflux esophagitis.                           - Normal stomach.                           - Normal examined duodenum.                           - No specimens collected. Recommendation:           - Patient has a contact number available for  emergencies. The signs and symptoms of potential                            delayed complications were discussed with the                            patient. Return to normal activities tomorrow.                            Written discharge instructions were provided to the                            patient.                           - Resume previous diet.                           - Continue present medications.                           - Pantoprazole 40 mg daily is recommended (for at                            least 8 weeks) to allow time for esophagitis to                            heal and determine if regurgitation symptoms                            resolve. Beverley Fiedler, MD 07/24/2023 9:55:24 AM This report has been signed electronically.

## 2023-07-25 ENCOUNTER — Telehealth: Payer: Self-pay

## 2023-07-25 NOTE — Telephone Encounter (Signed)
 No answer after follow up call. Voice message left.

## 2023-08-18 ENCOUNTER — Other Ambulatory Visit (HOSPITAL_COMMUNITY): Payer: Self-pay

## 2023-09-06 DIAGNOSIS — D485 Neoplasm of uncertain behavior of skin: Secondary | ICD-10-CM | POA: Diagnosis not present

## 2023-09-06 DIAGNOSIS — Z85828 Personal history of other malignant neoplasm of skin: Secondary | ICD-10-CM | POA: Diagnosis not present

## 2023-09-06 DIAGNOSIS — L814 Other melanin hyperpigmentation: Secondary | ICD-10-CM | POA: Diagnosis not present

## 2023-09-06 DIAGNOSIS — L821 Other seborrheic keratosis: Secondary | ICD-10-CM | POA: Diagnosis not present

## 2023-09-06 DIAGNOSIS — D225 Melanocytic nevi of trunk: Secondary | ICD-10-CM | POA: Diagnosis not present

## 2023-09-06 DIAGNOSIS — L570 Actinic keratosis: Secondary | ICD-10-CM | POA: Diagnosis not present

## 2023-09-06 DIAGNOSIS — D2261 Melanocytic nevi of right upper limb, including shoulder: Secondary | ICD-10-CM | POA: Diagnosis not present

## 2023-09-06 DIAGNOSIS — D2262 Melanocytic nevi of left upper limb, including shoulder: Secondary | ICD-10-CM | POA: Diagnosis not present

## 2023-09-08 ENCOUNTER — Encounter: Payer: Self-pay | Admitting: Family Medicine

## 2023-09-08 ENCOUNTER — Ambulatory Visit (INDEPENDENT_AMBULATORY_CARE_PROVIDER_SITE_OTHER)

## 2023-09-08 ENCOUNTER — Ambulatory Visit: Admitting: Family Medicine

## 2023-09-08 ENCOUNTER — Other Ambulatory Visit (HOSPITAL_COMMUNITY): Payer: Self-pay

## 2023-09-08 VITALS — BP 136/82 | HR 71 | Ht 73.0 in | Wt 218.0 lb

## 2023-09-08 DIAGNOSIS — M5441 Lumbago with sciatica, right side: Secondary | ICD-10-CM

## 2023-09-08 DIAGNOSIS — M5442 Lumbago with sciatica, left side: Secondary | ICD-10-CM | POA: Diagnosis not present

## 2023-09-08 DIAGNOSIS — M47816 Spondylosis without myelopathy or radiculopathy, lumbar region: Secondary | ICD-10-CM | POA: Diagnosis not present

## 2023-09-08 DIAGNOSIS — M545 Low back pain, unspecified: Secondary | ICD-10-CM | POA: Diagnosis not present

## 2023-09-08 DIAGNOSIS — G8929 Other chronic pain: Secondary | ICD-10-CM

## 2023-09-08 DIAGNOSIS — M419 Scoliosis, unspecified: Secondary | ICD-10-CM | POA: Diagnosis not present

## 2023-09-08 MED ORDER — GABAPENTIN 100 MG PO CAPS
100.0000 mg | ORAL_CAPSULE | Freq: Three times a day (TID) | ORAL | 3 refills | Status: AC | PRN
  Filled 2023-09-08: qty 90, 10d supply, fill #0

## 2023-09-08 MED ORDER — PREDNISONE 10 MG (48) PO TBPK
ORAL_TABLET | Freq: Every day | ORAL | 0 refills | Status: AC
  Filled 2023-09-08: qty 48, 12d supply, fill #0

## 2023-09-08 NOTE — Patient Instructions (Addendum)
 Thank you for coming in today.   Please get an Xray today before you leave \  You should hear from MRI scheduling within 1 week. If you do not hear please let me know.

## 2023-09-08 NOTE — Progress Notes (Signed)
 I, Gary Carpenter, CMA acting as a scribe for Gary Juniper, MD.  Gary Carpenter is a 83 y.o. male who presents to [facility] today for LBP. Pt was previously seen by Dr. Alease Hunter in 2022 for a similar complaint and was referred to PT.  Today, pt c/o LBP x several years with sx exacerbation more recently. Pt locates pain to bilateral lower back. Has upcoming trip to [place], leaving this upcoming Tuesday, for 10 days. Notes radiating pain into the LLQ of the abd. Pain also radiating into the gluteal region and hips. Has radiating pain into both legs, R>L, but not beyond the knee. Catching pain with golf swing. Takes Tylenol before golfing.   Radiating pain: present bilaterally LE numbness/tingling:  LE weakness: generalized muscle weakness Aggravates: difficulty with ambulation Treatments tried: Tylenol, heat  Dx imaging: 11/25/20 L-spine XR  Pertinent review of systems: No fevers or chills  Relevant historical information: Hypertension and hyperlipidemia.  History of low back pain and lumbar radiculopathy involving the right leg.   Exam:  BP 136/82   Pulse 71   Ht 6\' 1"  (1.854 m)   Wt 218 lb (98.9 kg)   SpO2 96%   BMI 28.76 kg/m  General: Well Developed, well nourished, and in no acute distress.   MSK: Lumbar spine: Normal appearing Nontender palpation spinal midline. Decreased lumbar motion. Lower extremity strength is intact.    Lab and Radiology Results  X-ray images lumbar spine obtained today personally and independently interpreted. Mild scoliosis with multiple level degeneration.  No acute fractures are visible. Await formal radiology review     Assessment and Plan: 83 y.o. male with chronic left low back pain with right lumbar radiculopathy.  Symptoms are most consistent with right L5 but he is symptoms though be consistent for other levels.  He has had a good trial of conservative management including some formal physical  therapy initially and over the last 6 months he has done at least 8 weeks concurrently of physician directed home exercise program without significant benefit.  Plan for MRI lumbar spine to further evaluate source of pain and for potential injection planning..  This will not help him for his upcoming trip leaving on Tuesday, April 29.  For that trip will prescribe prednisone  and gabapentin .  We talked a little bit about opiates and he declined.  Expect MRI will completed after he returns and we can review the findings together.   PDMP not reviewed this encounter. Orders Placed This Encounter  Procedures   DG Lumbar Spine 2-3 Views    Standing Status:   Future    Number of Occurrences:   1    Expiration Date:   10/08/2023    Reason for Exam (SYMPTOM  OR DIAGNOSIS REQUIRED):   low back pain    Preferred imaging location?:   [facility]   MR Lumbar Spine Wo Contrast    Standing Status:   Future    Expiration Date:   09/07/2024    What is the patient's sedation requirement?:   No Sedation    Does the patient have a pacemaker or implanted devices?:   No    Preferred imaging location?:   [facility] (table limit-550lbs)   Meds ordered this encounter  Medications   predniSONE  (STERAPRED UNI-PAK 48 TAB) 10 MG (48) TBPK tablet    Sig: Take as directed on package for 12 days    Dispense:  48 tablet  Refill:  0    Do not mail   gabapentin  (NEURONTIN ) 100 MG capsule    Sig: Take 1-3 capsules (100-300 mg total) by mouth 3 (three) times daily as needed.    Dispense:  90 capsule    Refill:  3    Do not mail     Discussed warning signs or symptoms. Please see discharge instructions. Patient expresses understanding.   The above documentation has been reviewed and is accurate and complete Gary Carpenter, M.D.

## 2023-09-14 NOTE — Progress Notes (Signed)
 Low back x-ray shows mild arthritis of the low back.

## 2023-09-26 ENCOUNTER — Other Ambulatory Visit (HOSPITAL_COMMUNITY): Payer: Self-pay

## 2023-09-26 DIAGNOSIS — E785 Hyperlipidemia, unspecified: Secondary | ICD-10-CM | POA: Diagnosis not present

## 2023-09-26 DIAGNOSIS — I10 Essential (primary) hypertension: Secondary | ICD-10-CM | POA: Diagnosis not present

## 2023-09-26 DIAGNOSIS — I2510 Atherosclerotic heart disease of native coronary artery without angina pectoris: Secondary | ICD-10-CM | POA: Diagnosis not present

## 2023-09-26 DIAGNOSIS — Z8719 Personal history of other diseases of the digestive system: Secondary | ICD-10-CM | POA: Diagnosis not present

## 2023-09-26 DIAGNOSIS — E1169 Type 2 diabetes mellitus with other specified complication: Secondary | ICD-10-CM | POA: Diagnosis not present

## 2023-09-26 MED ORDER — PANTOPRAZOLE SODIUM 40 MG PO TBEC
40.0000 mg | DELAYED_RELEASE_TABLET | ORAL | 1 refills | Status: AC
  Filled 2023-09-26: qty 90, 90d supply, fill #0
  Filled 2023-12-26: qty 90, 90d supply, fill #1

## 2023-09-27 DIAGNOSIS — D0462 Carcinoma in situ of skin of left upper limb, including shoulder: Secondary | ICD-10-CM | POA: Diagnosis not present

## 2023-09-27 DIAGNOSIS — D485 Neoplasm of uncertain behavior of skin: Secondary | ICD-10-CM | POA: Diagnosis not present

## 2023-09-27 DIAGNOSIS — Z85828 Personal history of other malignant neoplasm of skin: Secondary | ICD-10-CM | POA: Diagnosis not present

## 2023-09-27 DIAGNOSIS — C44219 Basal cell carcinoma of skin of left ear and external auricular canal: Secondary | ICD-10-CM | POA: Diagnosis not present

## 2023-10-27 ENCOUNTER — Encounter: Payer: Self-pay | Admitting: Family

## 2023-10-27 ENCOUNTER — Other Ambulatory Visit (HOSPITAL_COMMUNITY): Payer: Self-pay

## 2023-10-27 MED ORDER — HYDROCHLOROTHIAZIDE 25 MG PO TABS
25.0000 mg | ORAL_TABLET | Freq: Every morning | ORAL | 3 refills | Status: AC
  Filled 2023-10-27: qty 90, 90d supply, fill #0

## 2023-10-30 ENCOUNTER — Other Ambulatory Visit (HOSPITAL_COMMUNITY): Payer: Self-pay

## 2023-11-01 ENCOUNTER — Other Ambulatory Visit (HOSPITAL_COMMUNITY): Payer: Self-pay

## 2023-11-01 DIAGNOSIS — C44219 Basal cell carcinoma of skin of left ear and external auricular canal: Secondary | ICD-10-CM | POA: Diagnosis not present

## 2023-11-01 DIAGNOSIS — Z85828 Personal history of other malignant neoplasm of skin: Secondary | ICD-10-CM | POA: Diagnosis not present

## 2023-11-01 MED ORDER — DOXYCYCLINE HYCLATE 100 MG PO CAPS
ORAL_CAPSULE | ORAL | 0 refills | Status: AC
  Filled 2023-11-01: qty 10, 5d supply, fill #0

## 2023-11-01 MED ORDER — OLMESARTAN MEDOXOMIL 40 MG PO TABS
40.0000 mg | ORAL_TABLET | Freq: Every day | ORAL | 3 refills | Status: AC
  Filled 2023-11-01: qty 90, 90d supply, fill #0

## 2023-12-11 DIAGNOSIS — H35371 Puckering of macula, right eye: Secondary | ICD-10-CM | POA: Diagnosis not present

## 2023-12-11 DIAGNOSIS — Z961 Presence of intraocular lens: Secondary | ICD-10-CM | POA: Diagnosis not present

## 2023-12-11 DIAGNOSIS — H53143 Visual discomfort, bilateral: Secondary | ICD-10-CM | POA: Diagnosis not present

## 2023-12-11 DIAGNOSIS — E119 Type 2 diabetes mellitus without complications: Secondary | ICD-10-CM | POA: Diagnosis not present

## 2023-12-13 DIAGNOSIS — E1169 Type 2 diabetes mellitus with other specified complication: Secondary | ICD-10-CM | POA: Diagnosis not present

## 2023-12-13 DIAGNOSIS — I10 Essential (primary) hypertension: Secondary | ICD-10-CM | POA: Diagnosis not present

## 2023-12-13 DIAGNOSIS — E785 Hyperlipidemia, unspecified: Secondary | ICD-10-CM | POA: Diagnosis not present

## 2023-12-18 ENCOUNTER — Other Ambulatory Visit (HOSPITAL_BASED_OUTPATIENT_CLINIC_OR_DEPARTMENT_OTHER): Payer: Self-pay

## 2023-12-18 ENCOUNTER — Other Ambulatory Visit (HOSPITAL_COMMUNITY): Payer: Self-pay

## 2023-12-18 ENCOUNTER — Other Ambulatory Visit: Payer: Self-pay

## 2023-12-18 DIAGNOSIS — Z8679 Personal history of other diseases of the circulatory system: Secondary | ICD-10-CM | POA: Diagnosis not present

## 2023-12-18 DIAGNOSIS — Z0000 Encounter for general adult medical examination without abnormal findings: Secondary | ICD-10-CM | POA: Diagnosis not present

## 2023-12-18 DIAGNOSIS — R0989 Other specified symptoms and signs involving the circulatory and respiratory systems: Secondary | ICD-10-CM | POA: Diagnosis not present

## 2023-12-18 DIAGNOSIS — J309 Allergic rhinitis, unspecified: Secondary | ICD-10-CM | POA: Diagnosis not present

## 2023-12-18 DIAGNOSIS — K219 Gastro-esophageal reflux disease without esophagitis: Secondary | ICD-10-CM | POA: Diagnosis not present

## 2023-12-18 DIAGNOSIS — Z6829 Body mass index (BMI) 29.0-29.9, adult: Secondary | ICD-10-CM | POA: Diagnosis not present

## 2023-12-18 DIAGNOSIS — E1169 Type 2 diabetes mellitus with other specified complication: Secondary | ICD-10-CM | POA: Diagnosis not present

## 2023-12-18 DIAGNOSIS — D751 Secondary polycythemia: Secondary | ICD-10-CM | POA: Diagnosis not present

## 2023-12-18 DIAGNOSIS — I2510 Atherosclerotic heart disease of native coronary artery without angina pectoris: Secondary | ICD-10-CM | POA: Diagnosis not present

## 2023-12-18 DIAGNOSIS — I10 Essential (primary) hypertension: Secondary | ICD-10-CM | POA: Diagnosis not present

## 2023-12-18 DIAGNOSIS — R011 Cardiac murmur, unspecified: Secondary | ICD-10-CM | POA: Diagnosis not present

## 2023-12-18 MED ORDER — ROSUVASTATIN CALCIUM 10 MG PO TABS
10.0000 mg | ORAL_TABLET | ORAL | 3 refills | Status: AC
  Filled 2023-12-18: qty 36, 84d supply, fill #0

## 2023-12-18 MED ORDER — OZEMPIC (0.25 OR 0.5 MG/DOSE) 2 MG/3ML ~~LOC~~ SOPN
PEN_INJECTOR | SUBCUTANEOUS | 0 refills | Status: AC
  Filled 2023-12-18: qty 3, 56d supply, fill #0

## 2023-12-18 MED ORDER — OLMESARTAN MEDOXOMIL-HCTZ 40-25 MG PO TABS
1.0000 | ORAL_TABLET | Freq: Every day | ORAL | 3 refills | Status: AC
  Filled 2023-12-18: qty 90, 90d supply, fill #0

## 2023-12-18 MED ORDER — OZEMPIC (1 MG/DOSE) 4 MG/3ML ~~LOC~~ SOPN
1.0000 mg | PEN_INJECTOR | SUBCUTANEOUS | 1 refills | Status: AC
  Filled 2023-12-18: qty 9, 84d supply, fill #0

## 2023-12-19 ENCOUNTER — Other Ambulatory Visit (HOSPITAL_COMMUNITY): Payer: Self-pay

## 2023-12-19 MED ORDER — BOOSTRIX 5-2.5-18.5 LF-MCG/0.5 IM SUSY
PREFILLED_SYRINGE | INTRAMUSCULAR | 0 refills | Status: AC
  Filled 2023-12-19: qty 0.5, 7d supply, fill #0

## 2023-12-20 ENCOUNTER — Other Ambulatory Visit (HOSPITAL_COMMUNITY): Payer: Self-pay

## 2023-12-20 DIAGNOSIS — R011 Cardiac murmur, unspecified: Secondary | ICD-10-CM | POA: Diagnosis not present

## 2023-12-20 DIAGNOSIS — R0989 Other specified symptoms and signs involving the circulatory and respiratory systems: Secondary | ICD-10-CM | POA: Diagnosis not present

## 2024-02-21 ENCOUNTER — Other Ambulatory Visit (HOSPITAL_COMMUNITY): Payer: Self-pay

## 2024-02-21 DIAGNOSIS — M5459 Other low back pain: Secondary | ICD-10-CM | POA: Diagnosis not present

## 2024-02-21 MED ORDER — PREDNISONE 5 MG (21) PO TBPK
ORAL_TABLET | ORAL | 0 refills | Status: AC
  Filled 2024-02-21: qty 21, 6d supply, fill #0

## 2024-02-21 MED ORDER — PREDNISONE 5 MG (21) PO TBPK
ORAL_TABLET | ORAL | 1 refills | Status: DC
  Filled 2024-02-21: qty 21, 6d supply, fill #0

## 2024-03-01 DIAGNOSIS — M5459 Other low back pain: Secondary | ICD-10-CM | POA: Diagnosis not present

## 2024-03-07 DIAGNOSIS — M47816 Spondylosis without myelopathy or radiculopathy, lumbar region: Secondary | ICD-10-CM | POA: Diagnosis not present

## 2024-03-07 DIAGNOSIS — M5416 Radiculopathy, lumbar region: Secondary | ICD-10-CM | POA: Diagnosis not present

## 2024-03-11 ENCOUNTER — Other Ambulatory Visit (HOSPITAL_COMMUNITY): Payer: Self-pay

## 2024-03-11 MED ORDER — PREDNISONE 10 MG (21) PO TBPK
ORAL_TABLET | ORAL | 0 refills | Status: AC
  Filled 2024-03-11: qty 21, 6d supply, fill #0

## 2024-03-26 DIAGNOSIS — E1169 Type 2 diabetes mellitus with other specified complication: Secondary | ICD-10-CM | POA: Diagnosis not present

## 2024-03-26 DIAGNOSIS — I2510 Atherosclerotic heart disease of native coronary artery without angina pectoris: Secondary | ICD-10-CM | POA: Diagnosis not present

## 2024-03-28 ENCOUNTER — Other Ambulatory Visit (HOSPITAL_COMMUNITY): Payer: Self-pay

## 2024-03-28 DIAGNOSIS — M791 Myalgia, unspecified site: Secondary | ICD-10-CM | POA: Diagnosis not present

## 2024-03-28 DIAGNOSIS — E781 Pure hyperglyceridemia: Secondary | ICD-10-CM | POA: Diagnosis not present

## 2024-03-28 DIAGNOSIS — E1169 Type 2 diabetes mellitus with other specified complication: Secondary | ICD-10-CM | POA: Diagnosis not present

## 2024-03-28 DIAGNOSIS — T466X5A Adverse effect of antihyperlipidemic and antiarteriosclerotic drugs, initial encounter: Secondary | ICD-10-CM | POA: Diagnosis not present

## 2024-03-28 DIAGNOSIS — I10 Essential (primary) hypertension: Secondary | ICD-10-CM | POA: Diagnosis not present

## 2024-03-28 DIAGNOSIS — E785 Hyperlipidemia, unspecified: Secondary | ICD-10-CM | POA: Diagnosis not present

## 2024-03-28 MED ORDER — EZETIMIBE 10 MG PO TABS
10.0000 mg | ORAL_TABLET | Freq: Every day | ORAL | 3 refills | Status: AC
  Filled 2024-03-28: qty 90, 90d supply, fill #0

## 2024-03-30 ENCOUNTER — Other Ambulatory Visit (HOSPITAL_COMMUNITY): Payer: Self-pay

## 2024-04-01 ENCOUNTER — Other Ambulatory Visit (HOSPITAL_COMMUNITY): Payer: Self-pay

## 2024-04-01 MED ORDER — PANTOPRAZOLE SODIUM 40 MG PO TBEC
40.0000 mg | DELAYED_RELEASE_TABLET | Freq: Every morning | ORAL | 3 refills | Status: AC
  Filled 2024-04-01: qty 90, 90d supply, fill #0

## 2024-05-07 ENCOUNTER — Other Ambulatory Visit (HOSPITAL_COMMUNITY): Payer: Self-pay

## 2024-05-07 MED ORDER — OLMESARTAN MEDOXOMIL-HCTZ 40-25 MG PO TABS
1.0000 | ORAL_TABLET | Freq: Every day | ORAL | 3 refills | Status: AC
  Filled 2024-05-07: qty 90, 90d supply, fill #0
